# Patient Record
Sex: Female | Born: 1960 | Race: White | Hispanic: No | Smoking: Current every day smoker
Health system: Southern US, Community
[De-identification: ages and names within clinical notes are randomized; demographics above are authoritative.]

## PROBLEM LIST (undated history)

## (undated) DIAGNOSIS — I10 Essential (primary) hypertension: Secondary | ICD-10-CM

## (undated) DIAGNOSIS — R519 Headache, unspecified: Secondary | ICD-10-CM

## (undated) DIAGNOSIS — E119 Type 2 diabetes mellitus without complications: Secondary | ICD-10-CM

## (undated) DIAGNOSIS — F4310 Post-traumatic stress disorder, unspecified: Secondary | ICD-10-CM

## (undated) DIAGNOSIS — I509 Heart failure, unspecified: Secondary | ICD-10-CM

## (undated) DIAGNOSIS — F319 Bipolar disorder, unspecified: Secondary | ICD-10-CM

## (undated) DIAGNOSIS — R51 Headache: Secondary | ICD-10-CM

## (undated) DIAGNOSIS — J449 Chronic obstructive pulmonary disease, unspecified: Secondary | ICD-10-CM

## (undated) DIAGNOSIS — K219 Gastro-esophageal reflux disease without esophagitis: Secondary | ICD-10-CM

## (undated) DIAGNOSIS — G459 Transient cerebral ischemic attack, unspecified: Secondary | ICD-10-CM

## (undated) DIAGNOSIS — G4730 Sleep apnea, unspecified: Secondary | ICD-10-CM

## (undated) DIAGNOSIS — K259 Gastric ulcer, unspecified as acute or chronic, without hemorrhage or perforation: Secondary | ICD-10-CM

## (undated) DIAGNOSIS — J45909 Unspecified asthma, uncomplicated: Secondary | ICD-10-CM

## (undated) DIAGNOSIS — T148XXA Other injury of unspecified body region, initial encounter: Secondary | ICD-10-CM

## (undated) DIAGNOSIS — F200 Paranoid schizophrenia: Secondary | ICD-10-CM

## (undated) DIAGNOSIS — IMO0001 Reserved for inherently not codable concepts without codable children: Secondary | ICD-10-CM

## (undated) HISTORY — DX: Essential (primary) hypertension: I10

## (undated) HISTORY — DX: Type 2 diabetes mellitus without complications: E11.9

## (undated) HISTORY — DX: Unspecified asthma, uncomplicated: J45.909

## (undated) HISTORY — DX: Post-traumatic stress disorder, unspecified: F43.10

## (undated) HISTORY — DX: Bipolar disorder, unspecified: F31.9

## (undated) HISTORY — DX: Chronic obstructive pulmonary disease, unspecified: J44.9

## (undated) HISTORY — DX: Gastric ulcer, unspecified as acute or chronic, without hemorrhage or perforation: K25.9

## (undated) HISTORY — DX: Heart failure, unspecified: I50.9

---
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
## 2001-07-22 HISTORY — PX: BACK SURGERY: SHX140

## 2011-07-23 HISTORY — PX: CARDIAC CATHETERIZATION: SHX172

## 2012-05-25 LAB — BASIC METABOLIC PANEL
BUN: 22 mg/dL — AB (ref 4–21)
Creatinine: 1.1 mg/dL (ref 0.5–1.1)
GLUCOSE: 105 mg/dL
Potassium: 4.2 mmol/L (ref 3.4–5.3)
Sodium: 139 mmol/L (ref 137–147)

## 2012-05-25 LAB — HEPATIC FUNCTION PANEL
ALK PHOS: 118 U/L (ref 25–125)
ALT: 48 U/L — AB (ref 7–35)
AST: 37 U/L — AB (ref 13–35)
BILIRUBIN, TOTAL: 0.6 mg/dL

## 2012-05-25 LAB — CBC AND DIFFERENTIAL
HEMATOCRIT: 41 % (ref 36–46)
Hemoglobin: 14.4 g/dL (ref 12.0–16.0)
Platelets: 186 10*3/uL (ref 150–399)
WBC: 11.6 10^3/mL

## 2013-12-09 ENCOUNTER — Ambulatory Visit: Payer: Medicare Other | Admitting: Internal Medicine

## 2014-01-10 ENCOUNTER — Encounter: Payer: Self-pay | Admitting: Internal Medicine

## 2014-01-10 ENCOUNTER — Ambulatory Visit (INDEPENDENT_AMBULATORY_CARE_PROVIDER_SITE_OTHER): Payer: MEDICARE | Admitting: Internal Medicine

## 2014-01-10 VITALS — BP 142/80 | HR 90 | Temp 97.5°F | Resp 20 | Ht 65.16 in | Wt 243.2 lb

## 2014-01-10 DIAGNOSIS — I10 Essential (primary) hypertension: Secondary | ICD-10-CM

## 2014-01-10 DIAGNOSIS — G8929 Other chronic pain: Secondary | ICD-10-CM | POA: Insufficient documentation

## 2014-01-10 DIAGNOSIS — J4489 Other specified chronic obstructive pulmonary disease: Secondary | ICD-10-CM

## 2014-01-10 DIAGNOSIS — M545 Low back pain, unspecified: Secondary | ICD-10-CM

## 2014-01-10 DIAGNOSIS — J449 Chronic obstructive pulmonary disease, unspecified: Secondary | ICD-10-CM | POA: Insufficient documentation

## 2014-01-10 DIAGNOSIS — I2510 Atherosclerotic heart disease of native coronary artery without angina pectoris: Secondary | ICD-10-CM

## 2014-01-10 DIAGNOSIS — J45909 Unspecified asthma, uncomplicated: Secondary | ICD-10-CM | POA: Insufficient documentation

## 2014-01-10 DIAGNOSIS — Z9989 Dependence on other enabling machines and devices: Secondary | ICD-10-CM

## 2014-01-10 DIAGNOSIS — G4733 Obstructive sleep apnea (adult) (pediatric): Secondary | ICD-10-CM | POA: Insufficient documentation

## 2014-01-10 DIAGNOSIS — I25119 Atherosclerotic heart disease of native coronary artery with unspecified angina pectoris: Secondary | ICD-10-CM | POA: Insufficient documentation

## 2014-01-10 DIAGNOSIS — F4310 Post-traumatic stress disorder, unspecified: Secondary | ICD-10-CM | POA: Insufficient documentation

## 2014-01-10 DIAGNOSIS — I209 Angina pectoris, unspecified: Secondary | ICD-10-CM

## 2014-01-10 DIAGNOSIS — F319 Bipolar disorder, unspecified: Secondary | ICD-10-CM | POA: Insufficient documentation

## 2014-01-10 DIAGNOSIS — E119 Type 2 diabetes mellitus without complications: Secondary | ICD-10-CM | POA: Insufficient documentation

## 2014-01-10 DIAGNOSIS — J4520 Mild intermittent asthma, uncomplicated: Secondary | ICD-10-CM

## 2014-01-10 MED ORDER — LORAZEPAM 1 MG PO TABS: 1.0000 mg | ORAL_TABLET | Freq: Three times a day (TID) | ORAL | Status: DC | PRN

## 2014-01-10 MED ORDER — FUROSEMIDE 20 MG PO TABS: 20.0000 mg | ORAL_TABLET | Freq: Every day | ORAL | Status: AC

## 2014-01-10 MED ORDER — POTASSIUM CHLORIDE ER 10 MEQ PO TBCR: 10.0000 meq | EXTENDED_RELEASE_TABLET | Freq: Every day | ORAL | Status: DC

## 2014-01-10 MED ORDER — FLUOXETINE HCL 20 MG PO TABS: 20.0000 mg | ORAL_TABLET | Freq: Every day | ORAL | Status: DC

## 2014-01-10 MED ORDER — LORAZEPAM 1 MG PO TABS
1.0000 mg | ORAL_TABLET | Freq: Three times a day (TID) | ORAL | Status: DC | PRN
Start: 2014-01-10 — End: 2014-02-20

## 2014-01-10 MED ORDER — IPRATROPIUM-ALBUTEROL 0.5-2.5 (3) MG/3ML IN SOLN: 3.0000 mL | Freq: Four times a day (QID) | RESPIRATORY_TRACT | Status: DC | PRN

## 2014-01-10 MED ORDER — GABAPENTIN 400 MG PO CAPS: 400.0000 mg | ORAL_CAPSULE | Freq: Three times a day (TID) | ORAL | Status: DC | PRN

## 2014-01-10 MED ORDER — TRAMADOL HCL 50 MG PO TABS: 50.0000 mg | ORAL_TABLET | Freq: Three times a day (TID) | ORAL | Status: DC | PRN

## 2014-01-10 MED ORDER — ALBUTEROL SULFATE HFA 108 (90 BASE) MCG/ACT IN AERS: 1.0000 | INHALATION_SPRAY | Freq: Four times a day (QID) | RESPIRATORY_TRACT | Status: DC | PRN

## 2014-01-10 MED ORDER — CARVEDILOL 6.25 MG PO TABS: 6.2500 mg | ORAL_TABLET | Freq: Two times a day (BID) | ORAL | Status: DC

## 2014-01-10 MED ORDER — BACLOFEN 10 MG PO TABS: 10.0000 mg | ORAL_TABLET | Freq: Three times a day (TID) | ORAL | Status: DC | PRN

## 2014-01-10 MED ORDER — PRAVASTATIN SODIUM 20 MG PO TABS: 20.0000 mg | ORAL_TABLET | Freq: Every day | ORAL | Status: DC
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
## 2014-01-11 ENCOUNTER — Encounter: Payer: Self-pay | Admitting: *Deleted

## 2014-01-11 LAB — CBC WITH DIFFERENTIAL
Basophils Absolute: 0 10*3/uL (ref 0.0–0.2)
Basos: 0 %
Eos: 1 %
Eosinophils Absolute: 0.1 10*3/uL (ref 0.0–0.4)
HCT: 43.3 % (ref 34.0–46.6)
Hemoglobin: 14.1 g/dL (ref 11.1–15.9)
Immature Grans (Abs): 0 10*3/uL (ref 0.0–0.1)
Immature Granulocytes: 0 %
Lymphocytes Absolute: 3.2 10*3/uL — ABNORMAL HIGH (ref 0.7–3.1)
Lymphs: 31 %
MCH: 28.3 pg (ref 26.6–33.0)
MCHC: 32.6 g/dL (ref 31.5–35.7)
MCV: 87 fL (ref 79–97)
Monocytes Absolute: 0.5 10*3/uL (ref 0.1–0.9)
Monocytes: 5 %
Neutrophils Absolute: 6.3 10*3/uL (ref 1.4–7.0)
Neutrophils Relative %: 63 %
Platelets: 242 10*3/uL (ref 150–379)
RBC: 4.98 x10E6/uL (ref 3.77–5.28)
RDW: 14.9 % (ref 12.3–15.4)
WBC: 10.1 10*3/uL (ref 3.4–10.8)

## 2014-01-11 LAB — COMPREHENSIVE METABOLIC PANEL
ALT: 22 IU/L (ref 0–32)
AST: 16 IU/L (ref 0–40)
Albumin/Globulin Ratio: 1.5 (ref 1.1–2.5)
Albumin: 4.1 g/dL (ref 3.5–5.5)
Alkaline Phosphatase: 104 IU/L (ref 39–117)
BUN/Creatinine Ratio: 16 (ref 9–23)
BUN: 15 mg/dL (ref 6–24)
CO2: 21 mmol/L (ref 18–29)
Calcium: 9.4 mg/dL (ref 8.7–10.2)
Chloride: 102 mmol/L (ref 97–108)
Creatinine, Ser: 0.94 mg/dL (ref 0.57–1.00)
GFR calc Af Amer: 79 mL/min/{1.73_m2} (ref >59)
GFR calc non Af Amer: 69 mL/min/{1.73_m2} (ref >59)
Globulin, Total: 2.8 g/dL (ref 1.5–4.5)
Glucose: 82 mg/dL (ref 65–99)
Potassium: 4.7 mmol/L (ref 3.5–5.2)
Sodium: 140 mmol/L (ref 134–144)
Total Bilirubin: 0.3 mg/dL (ref 0.0–1.2)
Total Protein: 6.9 g/dL (ref 6.0–8.5)

## 2014-01-11 LAB — LIPID PANEL
Chol/HDL Ratio: 5.6 ratio units — ABNORMAL HIGH (ref 0.0–4.4)
Cholesterol, Total: 248 mg/dL — ABNORMAL HIGH (ref 100–199)
HDL: 44 mg/dL (ref >39)
LDL Calculated: 163 mg/dL — ABNORMAL HIGH (ref 0–99)
Triglycerides: 203 mg/dL — ABNORMAL HIGH (ref 0–149)
VLDL Cholesterol Cal: 41 mg/dL — ABNORMAL HIGH (ref 5–40)

## 2014-01-11 LAB — HEMOGLOBIN A1C
Est. average glucose Bld gHb Est-mCnc: 123 mg/dL
Hgb A1c MFr Bld: 5.9 % — ABNORMAL HIGH (ref 4.8–5.6)

## 2014-01-18 ENCOUNTER — Telehealth: Payer: Self-pay | Admitting: *Deleted

## 2014-01-18 NOTE — Telephone Encounter (Signed)
Patient called and stated that the Tramadol prescribed is causing Sleepiness. Please Advise.

## 2014-01-18 NOTE — Telephone Encounter (Signed)
The alternative stronger medications will only make sleepiness worse.  Does it help her pain?

## 2014-01-18 NOTE — Telephone Encounter (Signed)
LMOM to return call.

## 2014-01-18 NOTE — Telephone Encounter (Signed)
Patient also stated that she cannot afford all of her medications. She did get the Cholesterol, BP, Prozac, Lorazepam, Tramadol, Lasix,

## 2014-01-20 MED ORDER — DULOXETINE HCL 60 MG PO CPEP: 60.0000 mg | ORAL_CAPSULE | Freq: Every day | ORAL | Status: DC

## 2014-01-20 MED ORDER — DULOXETINE HCL 30 MG PO CPEP: ORAL_CAPSULE | ORAL | Status: DC

## 2014-01-20 NOTE — Telephone Encounter (Signed)
Patient was aware of medication changes. 2 rx's sent to the pharmacy for 30 mg and 60 mg

## 2014-01-20 NOTE — Telephone Encounter (Signed)
Stop tramadol.  Let's start cymbalta 30mg  daily for 2 weeks, then increase to 60mg  daily thereafter.

## 2014-01-20 NOTE — Telephone Encounter (Signed)
Spoke with patient, patient states Tramadol is not helping manage her pain. Patient in bed x 48 hours due to pain. Please advise

## 2014-01-27 ENCOUNTER — Ambulatory Visit: Payer: Self-pay | Admitting: Internal Medicine

## 2014-02-02 ENCOUNTER — Telehealth: Payer: Self-pay | Admitting: *Deleted

## 2014-02-02 MED ORDER — CITALOPRAM HYDROBROMIDE 20 MG PO TABS: ORAL_TABLET | ORAL | Status: DC

## 2014-02-02 NOTE — Telephone Encounter (Signed)
D/c duloxetine (all doses).  Start celexa 20mg  daily for one week, then 40mg  daily thereafter.  I recommend she get a drug plan so she can afford some medications.  Celexa is on the [facility] $5 list.  [facility]

## 2014-02-02 NOTE — Telephone Encounter (Signed)
Patient called and stated that she cannot afford the Duloxetine 30mg . She completed the first step but cannot afford the second. It is over $400.00. Please Advise.

## 2014-02-02 NOTE — Telephone Encounter (Signed)
Called [facility] [address] and spoke with Kathie RhodesBetty and she stated that patient picked up only 7 pills of 30mg  and it cost $51.97. Patient has no insurance has only a [employer] discount card. Please Advise.

## 2014-02-02 NOTE — Telephone Encounter (Signed)
Patient Notified and [facility] into [facility] [address]. D/C Duloxetine from patient's medication list.

## 2014-02-02 NOTE — Telephone Encounter (Signed)
Of course.  It was probably the best drug for her pain and depression.  The first step was 30mg --let's have her continue on 30mg .  Call pharmacy to find out if you give her 60 30mg  pills how much it will cost.  Maybe we can get her dose up that way.

## 2014-02-07 ENCOUNTER — Telehealth: Payer: Self-pay | Admitting: *Deleted

## 2014-02-07 DIAGNOSIS — R52 Pain, unspecified: Secondary | ICD-10-CM

## 2014-02-07 NOTE — Telephone Encounter (Signed)
Patient called and stated that the Celexa 20mg  that she was prescribed is not helping. It did the same thing the Tramadol did and put her to sleep. Thinks it is counter acting with another medication. No pain relief with this medication either. Patient stated that she wanted me to make you aware that she is allergic to Dioxide and this medication has Dioxide in it.  Please Advise.

## 2014-02-09 NOTE — Telephone Encounter (Signed)
LMOM to return call.

## 2014-02-09 NOTE — Addendum Note (Signed)
Addended by: Nelda SevereMAY, ANITA A on: 02/09/2014 11:25 AM   Modules accepted: Orders, Medications

## 2014-02-09 NOTE — Telephone Encounter (Signed)
Discontinue the celexa.  I would like to refer her to a pain clinic and a psychiatrist.

## 2014-02-09 NOTE — Telephone Encounter (Signed)
Patient Notified and patient will call and set up her psychiatrist appointment. Placed order into Epic for pain clinic Referral

## 2014-02-20 ENCOUNTER — Encounter (HOSPITAL_COMMUNITY): Payer: Self-pay | Admitting: Emergency Medicine

## 2014-02-20 ENCOUNTER — Emergency Department (HOSPITAL_COMMUNITY)
Admission: EM | Admit: 2014-02-20 | Discharge: 2014-02-20 | Disposition: A | Discharge status: Home / Self Care | Payer: Medicare Other | Attending: Emergency Medicine | Admitting: Emergency Medicine

## 2014-02-20 ENCOUNTER — Other Ambulatory Visit: Payer: Self-pay | Admitting: Internal Medicine

## 2014-02-20 DIAGNOSIS — G43809 Other migraine, not intractable, without status migrainosus: Secondary | ICD-10-CM | POA: Insufficient documentation

## 2014-02-20 DIAGNOSIS — I10 Essential (primary) hypertension: Secondary | ICD-10-CM | POA: Insufficient documentation

## 2014-02-20 DIAGNOSIS — Z880 Allergy status to penicillin: Secondary | ICD-10-CM | POA: Insufficient documentation

## 2014-02-20 DIAGNOSIS — M5441 Lumbago with sciatica, right side: Secondary | ICD-10-CM

## 2014-02-20 DIAGNOSIS — M5430 Sciatica, unspecified side: Secondary | ICD-10-CM | POA: Insufficient documentation

## 2014-02-20 DIAGNOSIS — Z8719 Personal history of other diseases of the digestive system: Secondary | ICD-10-CM | POA: Insufficient documentation

## 2014-02-20 DIAGNOSIS — F17200 Nicotine dependence, unspecified, uncomplicated: Secondary | ICD-10-CM | POA: Diagnosis not present

## 2014-02-20 DIAGNOSIS — R51 Headache: Secondary | ICD-10-CM | POA: Diagnosis present

## 2014-02-20 DIAGNOSIS — J4489 Other specified chronic obstructive pulmonary disease: Secondary | ICD-10-CM | POA: Insufficient documentation

## 2014-02-20 DIAGNOSIS — Z7982 Long term (current) use of aspirin: Secondary | ICD-10-CM | POA: Diagnosis not present

## 2014-02-20 DIAGNOSIS — F319 Bipolar disorder, unspecified: Secondary | ICD-10-CM | POA: Insufficient documentation

## 2014-02-20 DIAGNOSIS — J449 Chronic obstructive pulmonary disease, unspecified: Secondary | ICD-10-CM | POA: Diagnosis not present

## 2014-02-20 DIAGNOSIS — I509 Heart failure, unspecified: Secondary | ICD-10-CM | POA: Insufficient documentation

## 2014-02-20 DIAGNOSIS — M5442 Lumbago with sciatica, left side: Secondary | ICD-10-CM

## 2014-02-20 DIAGNOSIS — Z79899 Other long term (current) drug therapy: Secondary | ICD-10-CM | POA: Diagnosis not present

## 2014-02-20 DIAGNOSIS — E119 Type 2 diabetes mellitus without complications: Secondary | ICD-10-CM | POA: Insufficient documentation

## 2014-02-20 DIAGNOSIS — Z791 Long term (current) use of non-steroidal anti-inflammatories (NSAID): Secondary | ICD-10-CM | POA: Insufficient documentation

## 2014-02-20 DIAGNOSIS — G43C1 Periodic headache syndromes in child or adult, intractable: Secondary | ICD-10-CM

## 2014-02-20 HISTORY — DX: Paranoid schizophrenia: F20.0

## 2014-02-20 MED ORDER — OXYCODONE-ACETAMINOPHEN 5-325 MG PO TABS: 1.0000 | ORAL_TABLET | Freq: Four times a day (QID) | ORAL | Status: DC | PRN

## 2014-02-20 MED ORDER — OXYCODONE HCL 5 MG PO TABS
15.0000 mg | ORAL_TABLET | Freq: Once | ORAL | Status: AC
  Administered 2014-02-20: 15 mg via ORAL
  Filled 2014-02-20: qty 3

## 2014-02-20 NOTE — Discharge Instructions (Signed)
Please do not operate heavy machinery after taking your percocet. Please follow up with your outpatient clinic for further management of your pain.

## 2014-02-20 NOTE — ED Notes (Signed)
She c/o sudden onset of frontal h/a with emesis at 0700 this morning.  She states she vomited a total of three times at that time.  She states She took several ("eighteen") Advil today in attempt to ease her h/a and chronic back pain.  She is in no distress.  She tells me she has "hardware in my back) from previous injury.

## 2014-02-20 NOTE — ED Notes (Signed)
Pt from home c/o HTN, back pain, emesis and HA. Pt adds that she had SOB this am and did a Duo neb tx at home. Pt has hx of COPD, Asthma and bronchitis. Pt is a current every day smoker. Pt adds that she is having difficulty ambulating and states "my leg feel like they are going to give out on me and my pain is 150." Pt N/V started around 3:30 this am with 3 instances of emesis. Pt states that she has taken 18 ibuprofen and 2 Fioricet since 3:30 am today. Pt states that the emesis was before taking meds and has been able to keep them all down. Pt is A&O and in NAD

## 2014-02-20 NOTE — ED Provider Notes (Signed)
CSN: [id number]     Arrival date & time 02/20/14  1322 History   First MD Initiated Contact with Patient 02/20/14 1344     Chief Complaint  Patient presents with  . Headache  . Hypertension  . Emesis  . Back Pain    HPI  Elah Avellino is a 53 year old female with a history of bipolar disorder, PTSD, chronic low back pain, severe obesity who presents today for headaches. She reports that she woke up from sleep with a headache that started on the left side of her head and eventually wrapped around her entire head, 10/10 in severity, intense pressure in quality. She had some associated nausea and emesis. She tried to take ibuprofen for this, but that did not help to alleviate her symptoms. She also developed an exacerbation of her chronic back pain which she said was also 10/10, and was a sharp pain that radiated down to both of her legs. This is consistent with her chronic symptoms. The ibuprofen did not help with this either. Does report some elevated blood pressures on her home cuff, but these had pretty much resolved once she arrived at the hospital. She has a long history of pain and for several years, had been on methadone. Has not been on this in the last couple months.   She denies any fevers, chills, abdominal pain, diarrhea, constipation, dysuria, or hematuria.    Past Medical History  Diagnosis Date  . COPD (chronic obstructive pulmonary disease)   . Asthma   . Bipolar disorder, unspecified   . High blood pressure   . PTSD (post-traumatic stress disorder)   . Gastric ulcer   . CHF (congestive heart failure)   . Diabetes mellitus type II, controlled    Past Surgical History  Procedure Laterality Date  . Tonsillectomy  1968  . Cesarean section  1983  . Back surgery  2003    Dr.Broom, has lumbar spine implants TSRH-3D spinal system  . Cardiac catheterization  2013   Family History  Problem Relation Age of Onset  . Diabetes Maternal Grandmother   . Cancer Maternal  Grandmother     breast  . Hyperlipidemia Father   . Hypertension Father   . Cancer Paternal Grandmother     breast   History  Substance Use Topics  . Smoking status: Current Every Day Smoker -- 1.00 packs/day for 45 years    Types: Cigarettes  . Smokeless tobacco: Not on file  . Alcohol Use: Not on file   OB History   Grav Para Term Preterm Abortions TAB SAB Ect Mult Living                 Review of Systems Per HPI.   Allergies  Abilify; Hydrocodone; and Penicillins  Home Medications   Prior to Admission medications   Medication Sig Start Date End Date Taking? Authorizing Provider  albuterol (PROVENTIL HFA;VENTOLIN HFA) 108 (90 BASE) MCG/ACT inhaler Inhale 1 puff into the lungs every 6 (six) hours as needed for wheezing or shortness of breath. 01/10/14   [name] L [name], DO  aspirin EC 81 MG tablet Take 81 mg by mouth daily.    Historical Provider, MD  baclofen (LIORESAL) 10 MG tablet Take 1 tablet (10 mg total) by mouth 3 (three) times daily as needed for muscle spasms. 01/10/14   [name] L [name], DO  carvedilol (COREG) 6.25 MG tablet Take 1 tablet (6.25 mg total) by mouth 2 (two) times daily with a meal. 01/10/14  [name] L [name], DO  FLUoxetine (PROZAC) 20 MG tablet Take 1 tablet (20 mg total) by mouth daily. 01/10/14   [name] L [name], DO  furosemide (LASIX) 20 MG tablet Take 1 tablet (20 mg total) by mouth daily. 01/10/14   [name] L [name], DO  gabapentin (NEURONTIN) 400 MG capsule Take 1 capsule (400 mg total) by mouth 3 (three) times daily as needed. 01/10/14   [name] L [name], DO  ipratropium-albuterol (DUONEB) 0.5-2.5 (3) MG/3ML SOLN Take 3 mLs by nebulization every 6 (six) hours as needed. 01/10/14   [name] L [name], DO  loratadine (ALLERGY) 10 MG tablet Take 10 mg by mouth daily.    Historical Provider, MD  LORazepam (ATIVAN) 1 MG tablet Take 1 tablet (1 mg total) by mouth every 8 (eight) hours as needed for anxiety. 01/10/14   [name] L [name], DO  nitroGLYCERIN (NITROLINGUAL) 0.4  MG/SPRAY spray Place 1 spray under the tongue every 5 (five) minutes x 3 doses as needed for chest pain.    Historical Provider, MD  potassium chloride (KLOR-CON 10) 10 MEQ tablet Take 1 tablet (10 mEq total) by mouth daily. 01/10/14   [name] L [name], DO  pravastatin (PRAVACHOL) 20 MG tablet Take 1 tablet (20 mg total) by mouth daily. 01/10/14   [name] L [name], DO  ranitidine (ACID REDUCER) 150 MG tablet Take 150 mg by mouth as needed for heartburn.    Historical Provider, MD   BP 139/76  Pulse 93  Temp(Src) 98.1 F (36.7 C) (Oral)  Resp 17  SpO2 95% Physical Exam  Constitutional: She is oriented to person, place, and time. She appears well-developed and well-nourished. No distress.  HENT:  Head: Normocephalic and atraumatic.  Eyes: EOM are normal. Pupils are equal, round, and reactive to light.  Neck: No tracheal deviation present. No thyromegaly present.  Cardiovascular: Normal rate and regular rhythm.  Exam reveals no gallop and no friction rub.   No murmur heard. Pulmonary/Chest: No respiratory distress. She has no wheezes. She has no rales.  Minimal effort   Abdominal: Soft. She exhibits no distension. There is no tenderness.  Musculoskeletal:  Paraspinal tenderness upon palpation  Neurological: She is alert and oriented to person, place, and time. No cranial nerve deficit.  Strength and sensation grossly intact in upper and lower extremities.  Skin: Skin is warm and dry.     ED Course  Procedures (including critical care time) Labs Review Labs Reviewed - No data to display  Imaging Review No results found.   EKG Interpretation None      MDM   Final diagnoses:  None    Patient with recurrent symptoms of her chronic migraines and back pain. No focal neurological symptoms to suggest more concerning pathology at this point. Nausea has resolved since onset of symptoms.   - Percocet for pain management bridge until she can be seen in outpatient clinic.      Harold BarbanLawrence Esiah Bazinet, MD 02/20/14 1536

## 2014-02-21 ENCOUNTER — Other Ambulatory Visit: Payer: Self-pay | Admitting: *Deleted

## 2014-02-21 DIAGNOSIS — M549 Dorsalgia, unspecified: Secondary | ICD-10-CM

## 2014-02-21 DIAGNOSIS — R51 Headache: Secondary | ICD-10-CM

## 2014-02-21 NOTE — ED Provider Notes (Signed)
I saw and evaluated the patient, reviewed the resident's note and I agree with the findings and plan.  Pt w hx headaches, c/o gradual onset frontal headache c/w prior. No temporal tenderness. No neck stiffness or rigidity. Motor/sen intact. afeb.   Suzi RootsKevin E Elloise Roark, MD 02/21/14 [phone]

## 2014-02-28 ENCOUNTER — Other Ambulatory Visit (HOSPITAL_COMMUNITY): Payer: Self-pay | Admitting: Internal Medicine

## 2014-02-28 DIAGNOSIS — Z1231 Encounter for screening mammogram for malignant neoplasm of breast: Secondary | ICD-10-CM

## 2014-03-03 ENCOUNTER — Ambulatory Visit: Payer: Medicare Other | Admitting: Internal Medicine

## 2014-03-03 DIAGNOSIS — Z0289 Encounter for other administrative examinations: Secondary | ICD-10-CM

## 2014-04-14 ENCOUNTER — Ambulatory Visit (HOSPITAL_COMMUNITY): Payer: Medicare Other

## 2014-05-12 ENCOUNTER — Encounter: Payer: Medicare Other | Admitting: Internal Medicine

## 2014-05-21 ENCOUNTER — Other Ambulatory Visit: Payer: Self-pay | Admitting: Internal Medicine

## 2014-05-23 ENCOUNTER — Emergency Department (HOSPITAL_COMMUNITY): Payer: MEDICARE

## 2014-05-23 ENCOUNTER — Inpatient Hospital Stay (HOSPITAL_COMMUNITY)
Admission: EM | Admit: 2014-05-23 | Discharge: 2014-05-24 | DRG: 191 | Disposition: A | Discharge status: Home / Self Care | Payer: MEDICARE | Attending: Family Medicine | Admitting: Family Medicine

## 2014-05-23 ENCOUNTER — Encounter (HOSPITAL_COMMUNITY): Payer: Self-pay | Admitting: Emergency Medicine

## 2014-05-23 DIAGNOSIS — G8929 Other chronic pain: Secondary | ICD-10-CM | POA: Diagnosis present

## 2014-05-23 DIAGNOSIS — F200 Paranoid schizophrenia: Secondary | ICD-10-CM | POA: Diagnosis present

## 2014-05-23 DIAGNOSIS — I25119 Atherosclerotic heart disease of native coronary artery with unspecified angina pectoris: Secondary | ICD-10-CM | POA: Diagnosis present

## 2014-05-23 DIAGNOSIS — Z7982 Long term (current) use of aspirin: Secondary | ICD-10-CM

## 2014-05-23 DIAGNOSIS — J441 Chronic obstructive pulmonary disease with (acute) exacerbation: Secondary | ICD-10-CM | POA: Diagnosis not present

## 2014-05-23 DIAGNOSIS — R059 Cough, unspecified: Secondary | ICD-10-CM

## 2014-05-23 DIAGNOSIS — I509 Heart failure, unspecified: Secondary | ICD-10-CM | POA: Diagnosis present

## 2014-05-23 DIAGNOSIS — J45909 Unspecified asthma, uncomplicated: Secondary | ICD-10-CM | POA: Diagnosis present

## 2014-05-23 DIAGNOSIS — Z79899 Other long term (current) drug therapy: Secondary | ICD-10-CM

## 2014-05-23 DIAGNOSIS — I10 Essential (primary) hypertension: Secondary | ICD-10-CM | POA: Diagnosis present

## 2014-05-23 DIAGNOSIS — M545 Low back pain, unspecified: Secondary | ICD-10-CM | POA: Diagnosis present

## 2014-05-23 DIAGNOSIS — F17210 Nicotine dependence, cigarettes, uncomplicated: Secondary | ICD-10-CM | POA: Diagnosis present

## 2014-05-23 DIAGNOSIS — F4310 Post-traumatic stress disorder, unspecified: Secondary | ICD-10-CM | POA: Diagnosis present

## 2014-05-23 DIAGNOSIS — E119 Type 2 diabetes mellitus without complications: Secondary | ICD-10-CM | POA: Diagnosis present

## 2014-05-23 DIAGNOSIS — R197 Diarrhea, unspecified: Secondary | ICD-10-CM

## 2014-05-23 DIAGNOSIS — R05 Cough: Secondary | ICD-10-CM | POA: Diagnosis not present

## 2014-05-23 DIAGNOSIS — Z9989 Dependence on other enabling machines and devices: Secondary | ICD-10-CM

## 2014-05-23 DIAGNOSIS — F319 Bipolar disorder, unspecified: Secondary | ICD-10-CM | POA: Diagnosis present

## 2014-05-23 DIAGNOSIS — G4733 Obstructive sleep apnea (adult) (pediatric): Secondary | ICD-10-CM | POA: Diagnosis present

## 2014-05-23 DIAGNOSIS — Z8673 Personal history of transient ischemic attack (TIA), and cerebral infarction without residual deficits: Secondary | ICD-10-CM | POA: Diagnosis not present

## 2014-05-23 DIAGNOSIS — F3170 Bipolar disorder, currently in remission, most recent episode unspecified: Secondary | ICD-10-CM | POA: Diagnosis not present

## 2014-05-23 LAB — BASIC METABOLIC PANEL
Anion gap: 13 (ref 5–15)
BUN: 11 mg/dL (ref 6–23)
CO2: 27 mEq/L (ref 19–32)
Calcium: 9.1 mg/dL (ref 8.4–10.5)
Chloride: 104 mEq/L (ref 96–112)
Creatinine, Ser: 1.01 mg/dL (ref 0.50–1.10)
GFR calc Af Amer: 71 mL/min — ABNORMAL LOW (ref >90)
GFR, EST NON AFRICAN AMERICAN: 61 mL/min — AB (ref >90)
Glucose, Bld: 98 mg/dL (ref 70–99)
Potassium: 4.6 mEq/L (ref 3.7–5.3)
SODIUM: 144 meq/L (ref 137–147)

## 2014-05-23 LAB — CBC WITH DIFFERENTIAL/PLATELET
BASOS ABS: 0 10*3/uL (ref 0.0–0.1)
Basophils Relative: 0 % (ref 0–1)
EOS ABS: 0.2 10*3/uL (ref 0.0–0.7)
Eosinophils Relative: 2 % (ref 0–5)
HCT: 42.8 % (ref 36.0–46.0)
Hemoglobin: 13.9 g/dL (ref 12.0–15.0)
Lymphocytes Relative: 37 % (ref 12–46)
Lymphs Abs: 3.6 10*3/uL (ref 0.7–4.0)
MCH: 28.8 pg (ref 26.0–34.0)
MCHC: 32.5 g/dL (ref 30.0–36.0)
MCV: 88.8 fL (ref 78.0–100.0)
Monocytes Absolute: 0.3 10*3/uL (ref 0.1–1.0)
Monocytes Relative: 4 % (ref 3–12)
NEUTROS PCT: 57 % (ref 43–77)
Neutro Abs: 5.6 10*3/uL (ref 1.7–7.7)
PLATELETS: 192 10*3/uL (ref 150–400)
RBC: 4.82 MIL/uL (ref 3.87–5.11)
RDW: 14.3 % (ref 11.5–15.5)
WBC: 9.8 10*3/uL (ref 4.0–10.5)

## 2014-05-23 LAB — I-STAT TROPONIN, ED: TROPONIN I, POC: 0.05 ng/mL (ref 0.00–0.08)

## 2014-05-23 LAB — PRO B NATRIURETIC PEPTIDE: PRO B NATRI PEPTIDE: 129.5 pg/mL — AB (ref 0–125)

## 2014-05-23 LAB — TROPONIN I: Troponin I: <0.3 ng/mL (ref <0.30)

## 2014-05-23 MED ORDER — ALBUTEROL SULFATE (2.5 MG/3ML) 0.083% IN NEBU: 2.5000 mg | INHALATION_SOLUTION | RESPIRATORY_TRACT | Status: DC | PRN

## 2014-05-23 MED ORDER — IBUPROFEN 200 MG PO TABS: 600.0000 mg | ORAL_TABLET | Freq: Four times a day (QID) | ORAL | Status: DC | PRN

## 2014-05-23 MED ORDER — LEVOFLOXACIN 750 MG PO TABS
750.0000 mg | ORAL_TABLET | ORAL | Status: DC
  Administered 2014-05-23: 750 mg via ORAL
  Filled 2014-05-23 (×2): qty 1

## 2014-05-23 MED ORDER — FAMOTIDINE 20 MG PO TABS
20.0000 mg | ORAL_TABLET | Freq: Every day | ORAL | Status: DC
  Administered 2014-05-23 – 2014-05-24 (×2): 20 mg via ORAL
  Filled 2014-05-23 (×2): qty 1

## 2014-05-23 MED ORDER — PRAVASTATIN SODIUM 20 MG PO TABS
20.0000 mg | ORAL_TABLET | Freq: Every day | ORAL | Status: DC
  Administered 2014-05-24: 20 mg via ORAL
  Filled 2014-05-23: qty 1

## 2014-05-23 MED ORDER — NITROGLYCERIN 0.4 MG/SPRAY TL SOLN
1.0000 | Status: DC | PRN
  Filled 2014-05-23: qty 4.9

## 2014-05-23 MED ORDER — HEPARIN SODIUM (PORCINE) 5000 UNIT/ML IJ SOLN
5000.0000 [IU] | Freq: Three times a day (TID) | INTRAMUSCULAR | Status: DC
  Administered 2014-05-23 – 2014-05-24 (×2): 5000 [IU] via SUBCUTANEOUS
  Filled 2014-05-23 (×5): qty 1

## 2014-05-23 MED ORDER — METHYLPREDNISOLONE SODIUM SUCC 125 MG IJ SOLR
60.0000 mg | Freq: Once | INTRAMUSCULAR | Status: DC
  Filled 2014-05-23: qty 0.96

## 2014-05-23 MED ORDER — IPRATROPIUM BROMIDE 0.02 % IN SOLN
0.5000 mg | Freq: Once | RESPIRATORY_TRACT | Status: AC
  Administered 2014-05-23: 0.5 mg via RESPIRATORY_TRACT
  Filled 2014-05-23: qty 2.5

## 2014-05-23 MED ORDER — ASPIRIN EC 81 MG PO TBEC
81.0000 mg | DELAYED_RELEASE_TABLET | Freq: Every day | ORAL | Status: DC
  Administered 2014-05-24: 81 mg via ORAL
  Filled 2014-05-23: qty 1

## 2014-05-23 MED ORDER — IPRATROPIUM BROMIDE 0.02 % IN SOLN: 0.5000 mg | RESPIRATORY_TRACT | Status: DC

## 2014-05-23 MED ORDER — SODIUM CHLORIDE 0.9 % IJ SOLN
3.0000 mL | Freq: Two times a day (BID) | INTRAMUSCULAR | Status: DC
  Administered 2014-05-23: 3 mL via INTRAVENOUS

## 2014-05-23 MED ORDER — SODIUM CHLORIDE 0.9 % IV SOLN: 250.0000 mL | INTRAVENOUS | Status: DC | PRN

## 2014-05-23 MED ORDER — OXYCODONE-ACETAMINOPHEN 5-325 MG PO TABS
1.0000 | ORAL_TABLET | Freq: Four times a day (QID) | ORAL | Status: DC | PRN
  Administered 2014-05-24: 2 via ORAL
  Filled 2014-05-23: qty 2

## 2014-05-23 MED ORDER — GABAPENTIN 400 MG PO CAPS
400.0000 mg | ORAL_CAPSULE | Freq: Three times a day (TID) | ORAL | Status: DC
Start: 2014-05-23 — End: 2014-05-24
  Administered 2014-05-23 – 2014-05-24 (×2): 400 mg via ORAL
  Filled 2014-05-23 (×4): qty 1

## 2014-05-23 MED ORDER — ALBUTEROL SULFATE (2.5 MG/3ML) 0.083% IN NEBU: 2.5000 mg | INHALATION_SOLUTION | RESPIRATORY_TRACT | Status: DC

## 2014-05-23 MED ORDER — IPRATROPIUM-ALBUTEROL 0.5-2.5 (3) MG/3ML IN SOLN
3.0000 mL | RESPIRATORY_TRACT | Status: DC
  Administered 2014-05-23: 3 mL via RESPIRATORY_TRACT
  Filled 2014-05-23: qty 3

## 2014-05-23 MED ORDER — FLUOXETINE HCL 20 MG PO CAPS
20.0000 mg | ORAL_CAPSULE | Freq: Every day | ORAL | Status: DC
  Administered 2014-05-24: 20 mg via ORAL
  Filled 2014-05-23: qty 1

## 2014-05-23 MED ORDER — FUROSEMIDE 20 MG PO TABS
20.0000 mg | ORAL_TABLET | Freq: Every day | ORAL | Status: DC
  Administered 2014-05-24: 20 mg via ORAL
  Filled 2014-05-23: qty 1

## 2014-05-23 MED ORDER — CARVEDILOL 6.25 MG PO TABS
6.2500 mg | ORAL_TABLET | Freq: Two times a day (BID) | ORAL | Status: DC
  Administered 2014-05-24 (×2): 6.25 mg via ORAL
  Filled 2014-05-23 (×3): qty 1

## 2014-05-23 MED ORDER — METHYLPREDNISOLONE SODIUM SUCC 125 MG IJ SOLR
125.0000 mg | Freq: Once | INTRAMUSCULAR | Status: AC
  Administered 2014-05-23: 125 mg via INTRAVENOUS
  Filled 2014-05-23: qty 2

## 2014-05-23 MED ORDER — PANTOPRAZOLE SODIUM 40 MG PO TBEC
40.0000 mg | DELAYED_RELEASE_TABLET | Freq: Every day | ORAL | Status: DC
  Administered 2014-05-24: 40 mg via ORAL
  Filled 2014-05-23: qty 1

## 2014-05-23 MED ORDER — CODEINE SULFATE 30 MG PO TABS: 15.0000 mg | ORAL_TABLET | Freq: Four times a day (QID) | ORAL | Status: DC | PRN

## 2014-05-23 MED ORDER — ALBUTEROL SULFATE (2.5 MG/3ML) 0.083% IN NEBU
5.0000 mg | INHALATION_SOLUTION | Freq: Once | RESPIRATORY_TRACT | Status: AC
  Administered 2014-05-23: 5 mg via RESPIRATORY_TRACT
  Filled 2014-05-23: qty 6

## 2014-05-23 MED ORDER — METHYLPREDNISOLONE SODIUM SUCC 125 MG IJ SOLR
60.0000 mg | Freq: Four times a day (QID) | INTRAMUSCULAR | Status: DC
  Administered 2014-05-23 – 2014-05-24 (×2): 60 mg via INTRAVENOUS
  Filled 2014-05-23 (×6): qty 0.96

## 2014-05-23 MED ORDER — SODIUM CHLORIDE 0.9 % IJ SOLN: 3.0000 mL | INTRAMUSCULAR | Status: DC | PRN

## 2014-05-23 MED ORDER — BUTALBITAL-APAP-CAFFEINE 50-325-40 MG PO TABS: 1.0000 | ORAL_TABLET | Freq: Two times a day (BID) | ORAL | Status: DC | PRN

## 2014-05-23 MED ORDER — IPRATROPIUM-ALBUTEROL 0.5-2.5 (3) MG/3ML IN SOLN
3.0000 mL | Freq: Four times a day (QID) | RESPIRATORY_TRACT | Status: DC
  Administered 2014-05-24: 3 mL via RESPIRATORY_TRACT
  Filled 2014-05-23: qty 3

## 2014-05-23 MED ORDER — LORATADINE 10 MG PO TABS
10.0000 mg | ORAL_TABLET | Freq: Every day | ORAL | Status: DC | PRN
  Filled 2014-05-23: qty 1

## 2014-05-23 MED ORDER — ALBUTEROL (5 MG/ML) CONTINUOUS INHALATION SOLN
10.0000 mg/h | INHALATION_SOLUTION | RESPIRATORY_TRACT | Status: DC
  Administered 2014-05-23: 10 mg/h via RESPIRATORY_TRACT
  Filled 2014-05-23: qty 20

## 2014-05-23 MED ORDER — BACLOFEN 10 MG PO TABS
10.0000 mg | ORAL_TABLET | Freq: Three times a day (TID) | ORAL | Status: DC | PRN
  Administered 2014-05-24: 10 mg via ORAL
  Filled 2014-05-23 (×3): qty 1

## 2014-05-23 MED ORDER — LORAZEPAM 1 MG PO TABS: 1.0000 mg | ORAL_TABLET | Freq: Three times a day (TID) | ORAL | Status: DC | PRN

## 2014-05-23 NOTE — ED Provider Notes (Signed)
CSN: [id number]     Arrival date & time 05/23/14  1633 History   First MD Initiated Contact with Patient 05/23/14 1635     Chief Complaint  Patient presents with  . Shortness of Breath     (Consider location/radiation/quality/duration/timing/severity/associated sxs/prior Treatment) HPI Comments: Patient presents emergency department with chief complaint of shortness of breath and cough. She has a history of CHF, bronchitis, asthma, and COPD. She states that she has been sick for the past month. She states that she was seen by her primary care provider, who prescribed her a Z-Pak. She states that she improved somewhat, but then her symptoms have progressively worsened. She denies productive cough. She reports intermittent fevers and chills, but has not measured temperature. Today she has received 2 nebulizer treatments from EMS without much improvement. There are no aggravating factors.  The history is provided by the patient. No language interpreter was used.    Past Medical History  Diagnosis Date  . COPD (chronic obstructive pulmonary disease)   . Asthma   . Bipolar disorder, unspecified   . High blood pressure   . PTSD (post-traumatic stress disorder)   . Gastric ulcer   . CHF (congestive heart failure)   . Diabetes mellitus type II, controlled   . Paranoid schizophrenia    Past Surgical History  Procedure Laterality Date  . Tonsillectomy  1968  . Cesarean section  1983  . Back surgery  2003    Dr.Broom, has lumbar spine implants [facility]SR[facility]-3D spinal system  . Cardiac catheterization  2013   Family History  Problem Relation Age of Onset  . Diabetes Maternal Grandmother   . Cancer Maternal Grandmother     breast  . Hyperlipidemia Father   . Hypertension Father   . Cancer Paternal Grandmother     breast   History  Substance Use Topics  . Smoking status: Current Every Day Smoker -- 1.00 packs/day for 45 years    Types: Cigarettes  . Smokeless tobacco: Not on file  .  Alcohol Use: Not on file   OB History    No data available     Review of Systems  Constitutional: Negative for fever and chills.  Respiratory: Positive for cough, shortness of breath and wheezing.   Cardiovascular: Positive for chest pain.  Gastrointestinal: Negative for nausea, vomiting, diarrhea and constipation.  Genitourinary: Negative for dysuria.  All other systems reviewed and are negative.     Allergies  Other; Penicillins; Abilify; and Hydrocodone  Home Medications   Prior to Admission medications   Medication Sig Start Date End Date Taking? Authorizing Provider  albuterol (PROVENTIL HFA;VENTOLIN HFA) 108 (90 BASE) MCG/ACT inhaler Inhale 1 puff into the lungs every 6 (six) hours as needed for wheezing or shortness of breath. 01/10/14   [name] L [name], DO  aspirin EC 81 MG tablet Take 81 mg by mouth daily.    Historical Provider, MD  baclofen (LIORESAL) 10 MG tablet Take 1 tablet (10 mg total) by mouth 3 (three) times daily as needed for muscle spasms. 01/10/14   [name] L [name], DO  butalbital-acetaminophen-caffeine (FIORICET, ESGIC) 50-325-40 MG per tablet Take 1 tablet by mouth 2 (two) times daily as needed for headache.    Historical Provider, MD  FLUoxetine (PROZAC) 20 MG tablet TAKE 1 TABLET BY MOUTH EVERY DAY 05/23/14   [name] L [name], DO  furosemide (LASIX) 20 MG tablet Take 1 tablet (20 mg total) by mouth daily. 01/10/14   [name] L [name], DO  ibuprofen (  ADVIL,MOTRIN) 200 MG tablet Take 600 mg by mouth every 6 (six) hours as needed for moderate pain.    Historical Provider, MD  ipratropium-albuterol (DUONEB) 0.5-2.5 (3) MG/3ML SOLN Take 3 mLs by nebulization every 4 (four) hours as needed (wheezing).    Historical Provider, MD  loratadine (ALLERGY) 10 MG tablet Take 10 mg by mouth daily.    Historical Provider, MD  LORazepam (ATIVAN) 1 MG tablet TAKE 1 TABLET BY MOUTH EVERY 8 HOURS AS NEEDED    [name] L [name], DO  nitroGLYCERIN (NITROLINGUAL) 0.4 MG/SPRAY spray Place 1  spray under the tongue every 5 (five) minutes x 3 doses as needed for chest pain.    Historical Provider, MD  oxyCODONE-acetaminophen (PERCOCET/ROXICET) 5-325 MG per tablet Take 1-2 tablets by mouth every 6 (six) hours as needed for severe pain. 02/20/14   Suzi Roots, MD  pravastatin (PRAVACHOL) 20 MG tablet Take 1 tablet (20 mg total) by mouth daily. 01/10/14   [name] L [name], DO  ranitidine (ACID REDUCER) 150 MG tablet Take 150 mg by mouth as needed for heartburn.    Historical Provider, MD   BP 103/73 mmHg  Pulse 86  Temp(Src) 97.5 F (36.4 C) (Oral)  Resp 18  SpO2 100% Physical Exam  Constitutional: She is oriented to person, place, and time. She appears well-developed and well-nourished.  HENT:  Head: Normocephalic and atraumatic.  Eyes: Conjunctivae and EOM are normal. Pupils are equal, round, and reactive to light.  Neck: Normal range of motion. Neck supple.  Cardiovascular: Normal rate and regular rhythm.  Exam reveals no gallop and no friction rub.   No murmur heard. Pulmonary/Chest: Effort normal. No respiratory distress. She has wheezes. She has no rales. She exhibits no tenderness.  Diffuse wheezing  Abdominal: Soft. Bowel sounds are normal. She exhibits no distension and no mass. There is no tenderness. There is no rebound and no guarding.  Musculoskeletal: Normal range of motion. She exhibits no edema or tenderness.  Neurological: She is alert and oriented to person, place, and time.  Skin: Skin is warm and dry.  Psychiatric: She has a normal mood and affect. Her behavior is normal. Judgment and thought content normal.  Nursing note and vitals reviewed.   ED Course  Procedures (including critical care time) Results for orders placed or performed during the hospital encounter of 05/23/14  Pro b natriuretic peptide (BNP)  Result Value Ref Range   Pro B Natriuretic peptide (BNP) 129.5 (H) 0 - 125 pg/mL  CBC with Differential  Result Value Ref Range   WBC 9.8 4.0 -  10.5 K/uL   RBC 4.82 3.87 - 5.11 MIL/uL   Hemoglobin 13.9 12.0 - 15.0 g/dL   HCT 75.6 43.3 - 29.5 %   MCV 88.8 78.0 - 100.0 fL   MCH 28.8 26.0 - 34.0 pg   MCHC 32.5 30.0 - 36.0 g/dL   RDW 18.8 41.6 - 60.6 %   Platelets 192 150 - 400 K/uL   Neutrophils Relative % 57 43 - 77 %   Neutro Abs 5.6 1.7 - 7.7 K/uL   Lymphocytes Relative 37 12 - 46 %   Lymphs Abs 3.6 0.7 - 4.0 K/uL   Monocytes Relative 4 3 - 12 %   Monocytes Absolute 0.3 0.1 - 1.0 K/uL   Eosinophils Relative 2 0 - 5 %   Eosinophils Absolute 0.2 0.0 - 0.7 K/uL   Basophils Relative 0 0 - 1 %   Basophils Absolute 0.0 0.0 - 0.1  K/uL  Basic metabolic panel  Result Value Ref Range   Sodium 144 137 - 147 mEq/L   Potassium 4.6 3.7 - 5.3 mEq/L   Chloride 104 96 - 112 mEq/L   CO2 27 19 - 32 mEq/L   Glucose, Bld 98 70 - 99 mg/dL   BUN 11 6 - 23 mg/dL   Creatinine, Ser 1.021.01 0.50 - 1.10 mg/dL   Calcium 9.1 8.4 - 72.510.5 mg/dL   GFR calc non Af Amer 61 (L) >90 mL/min   GFR calc Af Amer 71 (L) >90 mL/min   Anion gap 13 5 - 15  I-Stat Troponin[facility])  Result Value Ref Range   Troponin i, poc 0.05 0.00 - 0.08 ng/mL   Comment 3           Dg Chest 2 View  05/23/2014   CLINICAL DATA:  53 year old female with cough and shortness of Breath with intermittent chest pain for 1 month. Initial encounter.  EXAM: CHEST  2 VIEW  COMPARISON:  None.  FINDINGS: Mildly low lung volumes. Normal cardiac size and mediastinal contours. Visualized tracheal air column is within normal limits. Increased interstitial markings diffusely. No pneumothorax or pleural effusion. No consolidation or confluent pulmonary opacity. Degenerative endplate changes in the spine. No acute osseous abnormality identified.  IMPRESSION: Chronic pulmonary interstitial changes suspected. No superimposed acute findings are identified.   Electronically Signed   By: Augusto GambleLee  Hall M.D.   On: 05/23/2014 17:14      EKG Interpretation None      MDM   Final diagnoses:  Cough   COPD exacerbation    Patient with cough, wheeze, shortness of breath. We'll check basic labs, will give breathing treatment, will reassess.  Patient with COPD exacerbation. She has had several nebulizer treatments as well as a continuous nebulizer. She was also given prednisone. She continues to have wheezing, and a short of breath with exertion. She denies active chest pain. Recommend admission to the hospitalist for further treatment.   Roxy Horsemanobert Yliana Gravois, PA-C 05/23/14 2053

## 2014-05-23 NOTE — ED Notes (Signed)
Bed: WA09 Expected date:  Expected time:  Means of arrival:  Comments: SOB 

## 2014-05-23 NOTE — H&P (Addendum)
[facility] History and Physical  Jadia Capers ZOX:[id number] DOB: Oct 29, 1960 DOA: 05/23/2014  Referring physician: ED physician PCP: Bufford Spikes, DO  Specialists:   Chief Complaint: shortness of breath and cough  HPI: Melissa Stanton is a 53 y.o. female with a past medical history L5-S1, COPD, bipolar disorder, hypertension, congestive heart failure, TIA, who presents with a cough and shortness of breath.  Patient reports that she was treated for sinusitis by her PCP with Z-Pak 1 month ago. Some of her symptoms, including runny nose and post nasal drip improved after she completed antibiotic treatment, but her cough persisted, which has been progressively getting worse. She took mucinux and Robitussin without significant help. She does not have sputum production. No fever, chills or chest pain. In the past several days, she started having shortness of breath. She also reports having nausea and gagging when she has severe cough. In the past 3 to 4 days, she developed diarrhea. She has approximately 3-4 times of bowel movements with watery stools. She has mild abdominal pain which is located over left side of abdomen. Patient is a taken Lasix for congestive heart failure, she does not have leg edema.  She was found to have a negative troponin, ProBNP 129.5 on admission. Chest x-ray shows chronic interstitial change without acute issues. She is admitted to inpatient for further evaluation and treatment.   Review of Systems: As presented in the history of presenting illness, rest negative.  Where does patient live? Lives with her fianc at home.  Can patient participate in ADLs? Yes  Allergy:  Allergies  Allergen Reactions  . Other Anaphylaxis and Other (See Comments)    Dioxide   . Penicillins Anaphylaxis  . Abilify [Aripiprazole] Other (See Comments)    Feels like a heart attack  . Hydrocodone Other (See Comments)    Pt flips out for 3 days    Past Medical History   Diagnosis Date  . COPD (chronic obstructive pulmonary disease)   . Asthma   . Bipolar disorder, unspecified   . High blood pressure   . PTSD (post-traumatic stress disorder)   . Gastric ulcer   . CHF (congestive heart failure)   . Diabetes mellitus type II, controlled   . Paranoid schizophrenia     Past Surgical History  Procedure Laterality Date  . Tonsillectomy  1968  . Cesarean section  1983  . Back surgery  2003    Dr.Broom, has lumbar spine implants TSRH-3D spinal system  . Cardiac catheterization  2013    Social History:  reports that she has been smoking Cigarettes.  She has a 45 pack-year smoking history. She does not have any smokeless tobacco history on file. Her alcohol and drug histories are not on file.  Family History:  Family History  Problem Relation Age of Onset  . Diabetes Maternal Grandmother   . Cancer Maternal Grandmother     breast  . Hyperlipidemia Father   . Hypertension Father   . Cancer Paternal Grandmother     breast     Prior to Admission medications   Medication Sig Start Date End Date Taking? Authorizing Provider  albuterol (PROVENTIL HFA;VENTOLIN HFA) 108 (90 BASE) MCG/ACT inhaler Inhale 1 puff into the lungs every 6 (six) hours as needed for wheezing or shortness of breath. Patient taking differently: Inhale 2 puffs into the lungs 2 (two) times daily.  01/10/14  Yes [name] L [name], DO  aspirin EC 81 MG tablet Take 81 mg by mouth daily.  Yes Historical Provider, MD  baclofen (LIORESAL) 10 MG tablet Take 1 tablet (10 mg total) by mouth 3 (three) times daily as needed for muscle spasms. 01/10/14  Yes [name] L [name], DO  butalbital-acetaminophen-caffeine (FIORICET, ESGIC) 50-325-40 MG per tablet Take 1 tablet by mouth 2 (two) times daily as needed for headache.   Yes Historical Provider, MD  carvedilol (COREG) 6.25 MG tablet Take 6.25 mg by mouth 2 (two) times daily.   Yes Historical Provider, MD  Dextromethorphan-Guaifenesin (ROBITUSSIN DM PO)  Take 10 mLs by mouth 3 (three) times daily.   Yes Historical Provider, MD  FLUoxetine (PROZAC) 20 MG capsule Take 20 mg by mouth daily.   Yes Historical Provider, MD  furosemide (LASIX) 20 MG tablet Take 1 tablet (20 mg total) by mouth daily. 01/10/14  Yes [name] L [name], DO  gabapentin (NEURONTIN) 400 MG capsule Take 400 mg by mouth 3 (three) times daily.   Yes Historical Provider, MD  ipratropium-albuterol (DUONEB) 0.5-2.5 (3) MG/3ML SOLN Take 3 mLs by nebulization every 4 (four) hours as needed (wheezing).   Yes Historical Provider, MD  loratadine (ALLERGY) 10 MG tablet Take 10 mg by mouth daily as needed for allergies.    Yes Historical Provider, MD  LORazepam (ATIVAN) 1 MG tablet Take 1 mg by mouth 3 (three) times daily.   Yes Historical Provider, MD  naproxen (NAPROSYN) 500 MG tablet Take 500 mg by mouth 2 (two) times daily.   Yes Historical Provider, MD  nitroGLYCERIN (NITROLINGUAL) 0.4 MG/SPRAY spray Place 1 spray under the tongue every 5 (five) minutes x 3 doses as needed for chest pain.   Yes Historical Provider, MD  omeprazole (PRILOSEC) 20 MG capsule Take 20 mg by mouth daily.   Yes Historical Provider, MD  oxyCODONE-acetaminophen (PERCOCET/ROXICET) 5-325 MG per tablet Take 1-2 tablets by mouth every 6 (six) hours as needed for severe pain. Patient taking differently: Take 1 tablet by mouth 3 (three) times daily.  02/20/14  Yes Suzi RootsKevin E Steinl, MD  pravastatin (PRAVACHOL) 20 MG tablet Take 1 tablet (20 mg total) by mouth daily. 01/10/14  Yes [name] L [name], DO  FLUoxetine (PROZAC) 20 MG tablet TAKE 1 TABLET BY MOUTH EVERY DAY 05/23/14   [name] L [name], DO  ibuprofen (ADVIL,MOTRIN) 200 MG tablet Take 600 mg by mouth every 6 (six) hours as needed for moderate pain.    Historical Provider, MD  LORazepam (ATIVAN) 1 MG tablet TAKE 1 TABLET BY MOUTH EVERY 8 HOURS AS NEEDED    [name] L [name], DO  ranitidine (ACID REDUCER) 150 MG tablet Take 150 mg by mouth as needed for heartburn.    Historical  Provider, MD    Physical Exam: Filed Vitals:   05/23/14 1834 05/23/14 1936 05/23/14 2115 05/23/14 2135  BP:  118/60 120/66 125/62  Pulse:  81 78 71  Temp:  96.6 F (35.9 C) 97.6 F (36.4 C) 97.8 F (36.6 C)  TempSrc:  Axillary Oral Oral  Resp:  24 20 16   Height:    5\' 5"  (1.651 m)  Weight:    173.3 kg (382 lb 0.9 oz)  SpO2: 96% 99% 95% 96%   General: Not in acute distress HEENT:       Eyes: PERRL, EOMI, no scleral icterus       ENT: No discharge from the ears and nose, no pharynx injection, no tonsillar enlargement.        Neck: No JVD, no bruit, no mass felt. Cardiac: S1/S2, RRR, No murmurs, gallops  or rubs Pulm: decreased air movement bilaterally. Diffused wheezing and rhonchi posteriorly. Abd: Soft, nondistended, mild tenderness over L side of abdomen, no rebound pain, no organomegaly, BS present Ext: No edema. 2+DP/PT pulse bilaterally Musculoskeletal: No joint deformities, erythema, or stiffness, ROM full Skin: No rashes.  Neuro: Alert and oriented X3, cranial nerves II-XII grossly intact, muscle strength 5/5 in all extremeties, sensation to light touch intact.  Psych: Patient is not psychotic, no suicidal or hemocidal ideation.  Labs on Admission:  Basic Metabolic Panel:  Recent Labs Lab 05/23/14 1718  NA 144  K 4.6  CL 104  CO2 27  GLUCOSE 98  BUN 11  CREATININE 1.01  CALCIUM 9.1   Liver Function Tests: No results for input(s): AST, ALT, ALKPHOS, BILITOT, PROT, ALBUMIN in the last 168 hours. No results for input(s): LIPASE, AMYLASE in the last 168 hours. No results for input(s): AMMONIA in the last 168 hours. CBC:  Recent Labs Lab 05/23/14 1718  WBC 9.8  NEUTROABS 5.6  HGB 13.9  HCT 42.8  MCV 88.8  PLT 192   Cardiac Enzymes:  Recent Labs Lab 05/23/14 2155  TROPONINI <0.30    BNP (last 3 results)  Recent Labs  05/23/14 1718  PROBNP 129.5*   CBG: No results for input(s): GLUCAP in the last 168 hours.  Radiological Exams on  Admission: Dg Chest 2 View  05/23/2014   CLINICAL DATA:  53 year old female with cough and shortness of Breath with intermittent chest pain for 1 month. Initial encounter.  EXAM: CHEST  2 VIEW  COMPARISON:  None.  FINDINGS: Mildly low lung volumes. Normal cardiac size and mediastinal contours. Visualized tracheal air column is within normal limits. Increased interstitial markings diffusely. No pneumothorax or pleural effusion. No consolidation or confluent pulmonary opacity. Degenerative endplate changes in the spine. No acute osseous abnormality identified.  IMPRESSION: Chronic pulmonary interstitial changes suspected. No superimposed acute findings are identified.   Electronically Signed   By: Augusto GambleLee  Hall M.D.   On: 05/23/2014 17:14    EKG: Independently reviewed.   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 Another potential differential diagnosis is CHF exacerbation, which is less likely given euvolemic status and low proBNP value 129.5 on admission.  -will admit patient to Med-surg bed  -Nebulizers: albuterol and Atrovent  -Solu-Medrol 60 mg q6h   -oral levaquin -EKG and troponin x3  - Codeine for cough  HTN:  Continue coreg  CHF: No 2-D echo record in Epic patient is euvolemic on admission. She is taking Lasix 20 mg daily. -will continue Lasix 20 mg daily  TIA: had three Episode of a TIA. No acute issues on admission -continue aspirin and  Pravastatin  Diarrhea: unclear etiology. Likely due to viral infection,  however given recent history of antibiotic use, we'll check C. Difficile pcr  OSA: CPAP  DVT ppx: SQ  Heparin     Code Status: Full code Family Communication: None at bed side. Disposition Plan: Admit to inpatient   Date of Service 05/23/2014    Lorretta HarpIU, Theadora Noyes [facility] Pager [phone]  If 7PM-7AM, please contact night-coverage [email or web address] Password [email or web address] 05/23/2014, 11:26 PM

## 2014-05-23 NOTE — ED Notes (Signed)
Pt denies CP but reports tightness related to difficulty breathing.

## 2014-05-23 NOTE — ED Notes (Signed)
Patient talking on cell phone with no decrease in O2 or being short of breath.

## 2014-05-23 NOTE — ED Notes (Signed)
Per EMS pt reports worsening SOB over past week with recent diagnosis of bronchitis. On arrival of EMS pt I/E wheezing. Pt given 10 mg of albuterol and 0.5 of Atrovent. Pt currently E wheezing.

## 2014-05-23 NOTE — ED Notes (Signed)
Received report from Karen KitchensMorgan R, Charity fundraiserN.

## 2014-05-23 NOTE — Progress Notes (Signed)
Patient refuses the use of nocturnal CPAP tonight. She states that she has not been able to tolerate her home unit in the past several months either. She agrees to notify RN or RT if she decides to be compliant at a later time tonight. Otherwise, RT will follow up tomorrow. Equipment remains at the bedside for future use.

## 2014-05-24 DIAGNOSIS — F319 Bipolar disorder, unspecified: Secondary | ICD-10-CM

## 2014-05-24 DIAGNOSIS — E119 Type 2 diabetes mellitus without complications: Secondary | ICD-10-CM

## 2014-05-24 LAB — COMPREHENSIVE METABOLIC PANEL
ALK PHOS: 107 U/L (ref 39–117)
ALT: 28 U/L (ref 0–35)
AST: 20 U/L (ref 0–37)
Albumin: 3.7 g/dL (ref 3.5–5.2)
Anion gap: 17 — ABNORMAL HIGH (ref 5–15)
BILIRUBIN TOTAL: 0.3 mg/dL (ref 0.3–1.2)
BUN: 15 mg/dL (ref 6–23)
CHLORIDE: 98 meq/L (ref 96–112)
CO2: 23 meq/L (ref 19–32)
Calcium: 9.7 mg/dL (ref 8.4–10.5)
Creatinine, Ser: 0.88 mg/dL (ref 0.50–1.10)
GFR, EST AFRICAN AMERICAN: 84 mL/min — AB (ref >90)
GFR, EST NON AFRICAN AMERICAN: 73 mL/min — AB (ref >90)
GLUCOSE: 169 mg/dL — AB (ref 70–99)
POTASSIUM: 4.2 meq/L (ref 3.7–5.3)
SODIUM: 138 meq/L (ref 137–147)
Total Protein: 7.7 g/dL (ref 6.0–8.3)

## 2014-05-24 LAB — CBC
HCT: 43.9 % (ref 36.0–46.0)
Hemoglobin: 14.1 g/dL (ref 12.0–15.0)
MCH: 28.1 pg (ref 26.0–34.0)
MCHC: 32.1 g/dL (ref 30.0–36.0)
MCV: 87.5 fL (ref 78.0–100.0)
PLATELETS: 221 10*3/uL (ref 150–400)
RBC: 5.02 MIL/uL (ref 3.87–5.11)
RDW: 14.3 % (ref 11.5–15.5)
WBC: 8.9 10*3/uL (ref 4.0–10.5)

## 2014-05-24 LAB — TROPONIN I

## 2014-05-24 MED ORDER — ONDANSETRON HCL 4 MG/2ML IJ SOLN
4.0000 mg | Freq: Once | INTRAMUSCULAR | Status: AC
  Administered 2014-05-24: 4 mg via INTRAVENOUS
  Filled 2014-05-24: qty 2

## 2014-05-24 MED ORDER — ONDANSETRON HCL 4 MG PO TABS: 4.0000 mg | ORAL_TABLET | Freq: Once | ORAL | Status: DC

## 2014-05-24 MED ORDER — OXYCODONE-ACETAMINOPHEN 5-325 MG PO TABS: 1.0000 | ORAL_TABLET | Freq: Four times a day (QID) | ORAL | Status: AC | PRN

## 2014-05-24 MED ORDER — PREDNISONE 50 MG PO TABS: ORAL_TABLET | ORAL | Status: AC

## 2014-05-24 MED ORDER — IPRATROPIUM-ALBUTEROL 0.5-2.5 (3) MG/3ML IN SOLN: 3.0000 mL | RESPIRATORY_TRACT | Status: AC | PRN

## 2014-05-24 MED ORDER — LEVOFLOXACIN 750 MG PO TABS: 750.0000 mg | ORAL_TABLET | ORAL | Status: AC

## 2014-05-24 NOTE — Discharge Summary (Signed)
Physician Discharge Summary  Melissa Stanton ZOX:[id number] DOB: [birth year] DOA: 05/23/2014  PCP: Bufford SpikesEED, [name], DO  Admit date: 05/23/2014 Discharge date: 05/24/2014  Time spent: > 35 minutes  Recommendations for Outpatient Follow-up:  1. Consider prolonging antibiotic course pending clinical condition on post discharge follow-up 2. Renewed patient's Atrovent and ipratropium prescription as she reports she was almost out and prefers the nebulizers. 3. Will provide prednisone for a short course of 5 days. Has received 1 day while at the hospital. She will get 4 more days of prednisone 50 mg daily.  Discharge Diagnoses: Please see list below  Discharge Condition: stable  Diet recommendation: diabetic diet  Filed Weights   05/23/14 2135 05/24/14 0516  Weight: 107.3 kg (236 lb 8.9 oz) 107.3 kg (236 lb 8.9 oz)    History of present illness:  53 y/o with h/o bipolar disorder, COPD, PTSD, chronic low back pain, OSA on C Pap. Who presented with acute COPD exacerbation.  Hospital Course:  Principal Problem:   COPD exacerbation - improved with antibiotics, bronchodilators, steroids - Breathing comfortably on room air on day of discharge  Active Problems:   Bipolar disorder - stable patient to continue home regimen on discharge    Essential hypertension, benign - stable patient to continue home regimen on discharge    Chronic low back pain -stable no new complaints while in house     Diabetes mellitus type II, controlled - patient to continue home regimen on discharge - diabetic diet    Diarrhea - Resolved no bouts of diarrhea reported by nursing on day of discharge  Procedures:  none  Consultations:  None  Discharge Exam: Filed Vitals:   05/24/14 0516  BP: 143/74  Pulse: 86  Temp: 97.5 F (36.4 C)  Resp: 18    General: Pt in nad, alert and awake Cardiovascular: rrr, no mrg Respiratory: prolonged expiratory phase, clear to auscultation, no  wheezes  Discharge Instructions You were cared for by a hospitalist during your hospital stay. If you have any questions about your discharge medications or the care you received while you were in the hospital after you are discharged, you can call the unit and asked to speak with the hospitalist on call if the hospitalist that took care of you is not available. Once you are discharged, your primary care physician will handle any further medical issues. Please note that NO REFILLS for any discharge medications will be authorized once you are discharged, as it is imperative that you return to your primary care physician (or establish a relationship with a primary care physician if you do not have one) for your aftercare needs so that they can reassess your need for medications and monitor your lab values.  Discharge Instructions    Call MD for:  difficulty breathing, headache or visual disturbances    Complete by:  As directed      Call MD for:  extreme fatigue    Complete by:  As directed      Call MD for:  temperature >100.4    Complete by:  As directed      Diet - low sodium heart healthy    Complete by:  As directed      Discharge instructions    Complete by:  As directed   Please follow up with your primary care physician tomorrow. Should any new questions arise regarding your medication please discuss with your pharmacist.     Increase activity slowly    Complete by:  As  directed           Current Discharge Medication List    START taking these medications   Details  levofloxacin (LEVAQUIN) 750 MG tablet Take 1 tablet (750 mg total) by mouth daily. Qty: 2 tablet, Refills: 0    ondansetron (ZOFRAN) 4 MG tablet Take 1 tablet (4 mg total) by mouth once. Qty: 20 tablet, Refills: 0    predniSONE (DELTASONE) 50 MG tablet Take one tablet by mouth daily. Qty: 4 tablet, Refills: 0      CONTINUE these medications which have CHANGED   Details  ipratropium-albuterol (DUONEB) 0.5-2.5  (3) MG/3ML SOLN Take 3 mLs by nebulization every 4 (four) hours as needed (wheezing). Qty: 360 mL, Refills: 0    oxyCODONE-acetaminophen (PERCOCET/ROXICET) 5-325 MG per tablet Take 1-2 tablets by mouth every 6 (six) hours as needed for severe pain. Qty: 20 tablet, Refills: 0      CONTINUE these medications which have NOT CHANGED   Details  aspirin EC 81 MG tablet Take 81 mg by mouth daily.    baclofen (LIORESAL) 10 MG tablet Take 1 tablet (10 mg total) by mouth 3 (three) times daily as needed for muscle spasms. Qty: 90 each, Refills: 3    butalbital-acetaminophen-caffeine (FIORICET, ESGIC) 50-325-40 MG per tablet Take 1 tablet by mouth 2 (two) times daily as needed for headache.    carvedilol (COREG) 6.25 MG tablet Take 6.25 mg by mouth 2 (two) times daily.    Dextromethorphan-Guaifenesin (ROBITUSSIN DM PO) Take 10 mLs by mouth 3 (three) times daily.    FLUoxetine (PROZAC) 20 MG capsule Take 20 mg by mouth daily.    furosemide (LASIX) 20 MG tablet Take 1 tablet (20 mg total) by mouth daily. Qty: 30 tablet, Refills: 3    gabapentin (NEURONTIN) 400 MG capsule Take 400 mg by mouth 3 (three) times daily.    loratadine (ALLERGY) 10 MG tablet Take 10 mg by mouth daily as needed for allergies.     !! LORazepam (ATIVAN) 1 MG tablet Take 1 mg by mouth 3 (three) times daily.    naproxen (NAPROSYN) 500 MG tablet Take 500 mg by mouth 2 (two) times daily.    nitroGLYCERIN (NITROLINGUAL) 0.4 MG/SPRAY spray Place 1 spray under the tongue every 5 (five) minutes x 3 doses as needed for chest pain.    omeprazole (PRILOSEC) 20 MG capsule Take 20 mg by mouth daily.    pravastatin (PRAVACHOL) 20 MG tablet Take 1 tablet (20 mg total) by mouth daily. Qty: 30 tablet, Refills: 3    FLUoxetine (PROZAC) 20 MG tablet TAKE 1 TABLET BY MOUTH EVERY DAY Qty: 30 tablet, Refills: 0    !! LORazepam (ATIVAN) 1 MG tablet TAKE 1 TABLET BY MOUTH EVERY 8 HOURS AS NEEDED Qty: 90 tablet, Refills: 0    ranitidine  (ACID REDUCER) 150 MG tablet Take 150 mg by mouth as needed for heartburn.     !! - Potential duplicate medications found. Please discuss with provider.    STOP taking these medications     albuterol (PROVENTIL HFA;VENTOLIN HFA) 108 (90 BASE) MCG/ACT inhaler      ibuprofen (ADVIL,MOTRIN) 200 MG tablet        Allergies  Allergen Reactions  . Other Anaphylaxis and Other (See Comments)    Dioxide   . Penicillins Anaphylaxis  . Abilify [Aripiprazole] Other (See Comments)    Feels like a heart attack  . Hydrocodone Other (See Comments)    Pt flips out for 3 days  The results of significant diagnostics from this hospitalization (including imaging, microbiology, ancillary and laboratory) are listed below for reference.    Significant Diagnostic Studies: Dg Chest 2 View  05/23/2014   CLINICAL DATA:  53 year old female with cough and shortness of Breath with intermittent chest pain for 1 month. Initial encounter.  EXAM: CHEST  2 VIEW  COMPARISON:  None.  FINDINGS: Mildly low lung volumes. Normal cardiac size and mediastinal contours. Visualized tracheal air column is within normal limits. Increased interstitial markings diffusely. No pneumothorax or pleural effusion. No consolidation or confluent pulmonary opacity. Degenerative endplate changes in the spine. No acute osseous abnormality identified.  IMPRESSION: Chronic pulmonary interstitial changes suspected. No superimposed acute findings are identified.   Electronically Signed   By: Augusto Gamble M.D.   On: 05/23/2014 17:14    Microbiology: No results found for this or any previous visit (from the past 240 hour(s)).   Labs: Basic Metabolic Panel:  Recent Labs Lab 05/23/14 1718 05/24/14 0345  NA 144 138  K 4.6 4.2  CL 104 98  CO2 27 23  GLUCOSE 98 169*  BUN 11 15  CREATININE 1.01 0.88  CALCIUM 9.1 9.7   Liver Function Tests:  Recent Labs Lab 05/24/14 0345  AST 20  ALT 28  ALKPHOS 107  BILITOT 0.3  PROT 7.7   ALBUMIN 3.7   No results for input(s): LIPASE, AMYLASE in the last 168 hours. No results for input(s): AMMONIA in the last 168 hours. CBC:  Recent Labs Lab 05/23/14 1718 05/24/14 0345  WBC 9.8 8.9  NEUTROABS 5.6  --   HGB 13.9 14.1  HCT 42.8 43.9  MCV 88.8 87.5  PLT 192 221   Cardiac Enzymes:  Recent Labs Lab 05/23/14 2155 05/24/14 0345  TROPONINI <0.30 <0.30   BNP: BNP (last 3 results)  Recent Labs  05/23/14 1718  PROBNP 129.5*   CBG: No results for input(s): GLUCAP in the last 168 hours.     Signed:  Penny Pia  [facility] 05/24/2014, 9:01 AM

## 2014-05-24 NOTE — Plan of Care (Signed)
Problem: Phase I Progression Outcomes Goal: Hemodynamically stable Outcome: Completed/Met Date Met:  05/24/14     

## 2014-05-24 NOTE — Plan of Care (Signed)
Problem: Phase I Progression Outcomes Goal: O2 sats > or equal 90% or at baseline Outcome: Completed/Met Date Met:  05/24/14

## 2014-05-26 LAB — RESPIRATORY VIRUS PANEL
ADENOVIRUS: NOT DETECTED
INFLUENZA A H1: NOT DETECTED
INFLUENZA A: NOT DETECTED
Influenza A H3: NOT DETECTED
Influenza B: NOT DETECTED
METAPNEUMOVIRUS: NOT DETECTED
PARAINFLUENZA 2 A: NOT DETECTED
PARAINFLUENZA 3 A: NOT DETECTED
Parainfluenza 1: NOT DETECTED
Respiratory Syncytial Virus A: NOT DETECTED
Respiratory Syncytial Virus B: NOT DETECTED
Rhinovirus: DETECTED — AB

## 2014-06-16 ENCOUNTER — Emergency Department (HOSPITAL_COMMUNITY): Payer: MEDICARE

## 2014-06-16 ENCOUNTER — Inpatient Hospital Stay (HOSPITAL_COMMUNITY)
Admission: EM | Admit: 2014-06-16 | Discharge: 2014-06-19 | DRG: 683 | Disposition: A | Discharge status: Home / Self Care | Payer: MEDICARE | Attending: Internal Medicine | Admitting: Internal Medicine

## 2014-06-16 ENCOUNTER — Encounter (HOSPITAL_COMMUNITY): Payer: Self-pay | Admitting: Emergency Medicine

## 2014-06-16 DIAGNOSIS — E872 Acidosis: Secondary | ICD-10-CM | POA: Diagnosis present

## 2014-06-16 DIAGNOSIS — Z79891 Long term (current) use of opiate analgesic: Secondary | ICD-10-CM

## 2014-06-16 DIAGNOSIS — R0602 Shortness of breath: Secondary | ICD-10-CM

## 2014-06-16 DIAGNOSIS — E860 Dehydration: Secondary | ICD-10-CM

## 2014-06-16 DIAGNOSIS — R197 Diarrhea, unspecified: Secondary | ICD-10-CM

## 2014-06-16 DIAGNOSIS — F200 Paranoid schizophrenia: Secondary | ICD-10-CM | POA: Diagnosis present

## 2014-06-16 DIAGNOSIS — A059 Bacterial foodborne intoxication, unspecified: Secondary | ICD-10-CM | POA: Diagnosis present

## 2014-06-16 DIAGNOSIS — G4733 Obstructive sleep apnea (adult) (pediatric): Secondary | ICD-10-CM | POA: Diagnosis present

## 2014-06-16 DIAGNOSIS — I10 Essential (primary) hypertension: Secondary | ICD-10-CM | POA: Diagnosis present

## 2014-06-16 DIAGNOSIS — Z7982 Long term (current) use of aspirin: Secondary | ICD-10-CM | POA: Diagnosis not present

## 2014-06-16 DIAGNOSIS — R112 Nausea with vomiting, unspecified: Secondary | ICD-10-CM | POA: Diagnosis present

## 2014-06-16 DIAGNOSIS — L509 Urticaria, unspecified: Secondary | ICD-10-CM | POA: Diagnosis present

## 2014-06-16 DIAGNOSIS — F319 Bipolar disorder, unspecified: Secondary | ICD-10-CM | POA: Diagnosis present

## 2014-06-16 DIAGNOSIS — J45909 Unspecified asthma, uncomplicated: Secondary | ICD-10-CM | POA: Diagnosis present

## 2014-06-16 DIAGNOSIS — Z8711 Personal history of peptic ulcer disease: Secondary | ICD-10-CM

## 2014-06-16 DIAGNOSIS — I509 Heart failure, unspecified: Secondary | ICD-10-CM | POA: Diagnosis present

## 2014-06-16 DIAGNOSIS — Z8673 Personal history of transient ischemic attack (TIA), and cerebral infarction without residual deficits: Secondary | ICD-10-CM | POA: Diagnosis not present

## 2014-06-16 DIAGNOSIS — Z6841 Body Mass Index (BMI) 40.0 and over, adult: Secondary | ICD-10-CM | POA: Diagnosis not present

## 2014-06-16 DIAGNOSIS — N179 Acute kidney failure, unspecified: Principal | ICD-10-CM | POA: Diagnosis present

## 2014-06-16 DIAGNOSIS — K219 Gastro-esophageal reflux disease without esophagitis: Secondary | ICD-10-CM | POA: Diagnosis present

## 2014-06-16 DIAGNOSIS — E119 Type 2 diabetes mellitus without complications: Secondary | ICD-10-CM | POA: Diagnosis present

## 2014-06-16 DIAGNOSIS — F17210 Nicotine dependence, cigarettes, uncomplicated: Secondary | ICD-10-CM | POA: Diagnosis present

## 2014-06-16 DIAGNOSIS — R21 Rash and other nonspecific skin eruption: Secondary | ICD-10-CM

## 2014-06-16 DIAGNOSIS — N289 Disorder of kidney and ureter, unspecified: Secondary | ICD-10-CM

## 2014-06-16 DIAGNOSIS — F4310 Post-traumatic stress disorder, unspecified: Secondary | ICD-10-CM | POA: Diagnosis present

## 2014-06-16 DIAGNOSIS — Z79899 Other long term (current) drug therapy: Secondary | ICD-10-CM

## 2014-06-16 DIAGNOSIS — J441 Chronic obstructive pulmonary disease with (acute) exacerbation: Secondary | ICD-10-CM | POA: Diagnosis present

## 2014-06-16 DIAGNOSIS — R0902 Hypoxemia: Secondary | ICD-10-CM | POA: Diagnosis present

## 2014-06-16 HISTORY — DX: Headache: R51

## 2014-06-16 HISTORY — DX: Gastro-esophageal reflux disease without esophagitis: K21.9

## 2014-06-16 HISTORY — DX: Other injury of unspecified body region, initial encounter: T14.8XXA

## 2014-06-16 HISTORY — DX: Headache, unspecified: R51.9

## 2014-06-16 HISTORY — DX: Transient cerebral ischemic attack, unspecified: G45.9

## 2014-06-16 HISTORY — DX: Sleep apnea, unspecified: G47.30

## 2014-06-16 HISTORY — DX: Reserved for inherently not codable concepts without codable children: IMO0001

## 2014-06-16 LAB — CBC
HCT: 49.8 % — ABNORMAL HIGH (ref 36.0–46.0)
Hemoglobin: 17 g/dL — ABNORMAL HIGH (ref 12.0–15.0)
MCH: 29.1 pg (ref 26.0–34.0)
MCHC: 34.1 g/dL (ref 30.0–36.0)
MCV: 85.3 fL (ref 78.0–100.0)
Platelets: 257 10*3/uL (ref 150–400)
RBC: 5.84 MIL/uL — ABNORMAL HIGH (ref 3.87–5.11)
RDW: 15.2 % (ref 11.5–15.5)
WBC: 6.8 10*3/uL (ref 4.0–10.5)

## 2014-06-16 LAB — CBC WITH DIFFERENTIAL/PLATELET
Basophils Absolute: 0 10*3/uL (ref 0.0–0.1)
Basophils Relative: 0 % (ref 0–1)
Eosinophils Absolute: 0 10*3/uL (ref 0.0–0.7)
Eosinophils Relative: 0 % (ref 0–5)
HCT: 55.7 % — ABNORMAL HIGH (ref 36.0–46.0)
HEMOGLOBIN: 19.5 g/dL — AB (ref 12.0–15.0)
LYMPHS ABS: 1.9 10*3/uL (ref 0.7–4.0)
LYMPHS PCT: 22 % (ref 12–46)
MCH: 30.1 pg (ref 26.0–34.0)
MCHC: 35 g/dL (ref 30.0–36.0)
MCV: 86.1 fL (ref 78.0–100.0)
MONOS PCT: 6 % (ref 3–12)
Monocytes Absolute: 0.5 10*3/uL (ref 0.1–1.0)
NEUTROS ABS: 6 10*3/uL (ref 1.7–7.7)
NEUTROS PCT: 72 % (ref 43–77)
PLATELETS: 285 10*3/uL (ref 150–400)
RBC: 6.47 MIL/uL — AB (ref 3.87–5.11)
RDW: 15.2 % (ref 11.5–15.5)
WBC: 8.4 10*3/uL (ref 4.0–10.5)

## 2014-06-16 LAB — TROPONIN I: Troponin I: <0.3 ng/mL (ref <0.30)

## 2014-06-16 LAB — COMPREHENSIVE METABOLIC PANEL
ALK PHOS: 78 U/L (ref 39–117)
ALT: 21 U/L (ref 0–35)
AST: 34 U/L (ref 0–37)
Albumin: 3.6 g/dL (ref 3.5–5.2)
Anion gap: 21 — ABNORMAL HIGH (ref 5–15)
BUN: 31 mg/dL — AB (ref 6–23)
CO2: 18 meq/L — AB (ref 19–32)
Calcium: 9.6 mg/dL (ref 8.4–10.5)
Chloride: 96 mEq/L (ref 96–112)
Creatinine, Ser: 2.17 mg/dL — ABNORMAL HIGH (ref 0.50–1.10)
GFR calc Af Amer: 28 mL/min — ABNORMAL LOW (ref >90)
GFR calc non Af Amer: 24 mL/min — ABNORMAL LOW (ref >90)
Glucose, Bld: 147 mg/dL — ABNORMAL HIGH (ref 70–99)
POTASSIUM: 4.9 meq/L (ref 3.7–5.3)
SODIUM: 135 meq/L — AB (ref 137–147)
TOTAL PROTEIN: 7.4 g/dL (ref 6.0–8.3)
Total Bilirubin: 0.8 mg/dL (ref 0.3–1.2)

## 2014-06-16 LAB — CREATININE, SERUM
Creatinine, Ser: 2.21 mg/dL — ABNORMAL HIGH (ref 0.50–1.10)
GFR calc Af Amer: 28 mL/min — ABNORMAL LOW (ref >90)
GFR calc non Af Amer: 24 mL/min — ABNORMAL LOW (ref >90)

## 2014-06-16 LAB — GLUCOSE, CAPILLARY
GLUCOSE-CAPILLARY: 138 mg/dL — AB (ref 70–99)
Glucose-Capillary: 156 mg/dL — ABNORMAL HIGH (ref 70–99)
Glucose-Capillary: 163 mg/dL — ABNORMAL HIGH (ref 70–99)

## 2014-06-16 LAB — C-REACTIVE PROTEIN: CRP: 25.7 mg/dL — ABNORMAL HIGH (ref <0.60)

## 2014-06-16 LAB — SEDIMENTATION RATE: Sed Rate: 2 mm/hr (ref 0–22)

## 2014-06-16 MED ORDER — DIPHENHYDRAMINE HCL 50 MG/ML IJ SOLN: 25.0000 mg | Freq: Four times a day (QID) | INTRAMUSCULAR | Status: DC | PRN

## 2014-06-16 MED ORDER — PROMETHAZINE HCL 25 MG/ML IJ SOLN: 25.0000 mg | Freq: Four times a day (QID) | INTRAMUSCULAR | Status: DC | PRN

## 2014-06-16 MED ORDER — HYDROMORPHONE HCL 1 MG/ML IJ SOLN: 1.0000 mg | INTRAMUSCULAR | Status: DC | PRN

## 2014-06-16 MED ORDER — ASPIRIN EC 81 MG PO TBEC
81.0000 mg | DELAYED_RELEASE_TABLET | Freq: Every day | ORAL | Status: DC
  Administered 2014-06-16 – 2014-06-19 (×4): 81 mg via ORAL
  Filled 2014-06-16 (×4): qty 1

## 2014-06-16 MED ORDER — BACLOFEN 10 MG PO TABS
10.0000 mg | ORAL_TABLET | Freq: Three times a day (TID) | ORAL | Status: DC | PRN
  Filled 2014-06-16: qty 1

## 2014-06-16 MED ORDER — LORATADINE 10 MG PO TABS
10.0000 mg | ORAL_TABLET | Freq: Every day | ORAL | Status: DC
  Administered 2014-06-16 – 2014-06-19 (×4): 10 mg via ORAL
  Filled 2014-06-16 (×4): qty 1

## 2014-06-16 MED ORDER — GABAPENTIN 400 MG PO CAPS
400.0000 mg | ORAL_CAPSULE | Freq: Three times a day (TID) | ORAL | Status: DC
  Administered 2014-06-16 – 2014-06-19 (×9): 400 mg via ORAL
  Filled 2014-06-16 (×11): qty 1

## 2014-06-16 MED ORDER — SODIUM CHLORIDE 0.9 % IV SOLN
INTRAVENOUS | Status: DC
  Administered 2014-06-16 – 2014-06-17 (×2): via INTRAVENOUS

## 2014-06-16 MED ORDER — GUAIFENESIN-DM 100-10 MG/5ML PO SYRP
5.0000 mL | ORAL_SOLUTION | ORAL | Status: DC | PRN
  Filled 2014-06-16: qty 5

## 2014-06-16 MED ORDER — ALUM & MAG HYDROXIDE-SIMETH 200-200-20 MG/5ML PO SUSP: 30.0000 mL | Freq: Four times a day (QID) | ORAL | Status: DC | PRN

## 2014-06-16 MED ORDER — LEVALBUTEROL HCL 0.63 MG/3ML IN NEBU
0.6300 mg | INHALATION_SOLUTION | Freq: Four times a day (QID) | RESPIRATORY_TRACT | Status: DC
Start: 2014-06-16 — End: 2014-06-16

## 2014-06-16 MED ORDER — FAMOTIDINE IN NACL 20-0.9 MG/50ML-% IV SOLN
20.0000 mg | Freq: Once | INTRAVENOUS | Status: AC
  Administered 2014-06-16: 20 mg via INTRAVENOUS
  Filled 2014-06-16: qty 50

## 2014-06-16 MED ORDER — GI COCKTAIL ~~LOC~~
30.0000 mL | Freq: Once | ORAL | Status: AC
  Administered 2014-06-16: 30 mL via ORAL
  Filled 2014-06-16: qty 30

## 2014-06-16 MED ORDER — NITROGLYCERIN 0.4 MG/SPRAY TL SOLN: 1.0000 | Status: DC | PRN

## 2014-06-16 MED ORDER — ACETAMINOPHEN 325 MG PO TABS: 650.0000 mg | ORAL_TABLET | Freq: Four times a day (QID) | ORAL | Status: DC | PRN

## 2014-06-16 MED ORDER — ACETAMINOPHEN 650 MG RE SUPP: 650.0000 mg | Freq: Four times a day (QID) | RECTAL | Status: DC | PRN

## 2014-06-16 MED ORDER — DIPHENHYDRAMINE HCL 50 MG/ML IJ SOLN
25.0000 mg | Freq: Once | INTRAMUSCULAR | Status: AC
  Administered 2014-06-16: 25 mg via INTRAVENOUS
  Filled 2014-06-16: qty 1

## 2014-06-16 MED ORDER — METRONIDAZOLE IN NACL 5-0.79 MG/ML-% IV SOLN
500.0000 mg | Freq: Three times a day (TID) | INTRAVENOUS | Status: DC
  Filled 2014-06-16 (×3): qty 100

## 2014-06-16 MED ORDER — ONDANSETRON HCL 4 MG/2ML IJ SOLN
4.0000 mg | Freq: Once | INTRAMUSCULAR | Status: AC
  Administered 2014-06-16: 4 mg via INTRAVENOUS
  Filled 2014-06-16: qty 2

## 2014-06-16 MED ORDER — SODIUM CHLORIDE 0.9 % IJ SOLN
3.0000 mL | Freq: Two times a day (BID) | INTRAMUSCULAR | Status: DC
  Administered 2014-06-18 (×2): 3 mL via INTRAVENOUS

## 2014-06-16 MED ORDER — CETYLPYRIDINIUM CHLORIDE 0.05 % MT LIQD
7.0000 mL | Freq: Two times a day (BID) | OROMUCOSAL | Status: DC
  Administered 2014-06-16 – 2014-06-19 (×4): 7 mL via OROMUCOSAL

## 2014-06-16 MED ORDER — ONDANSETRON HCL 4 MG/2ML IJ SOLN
4.0000 mg | Freq: Four times a day (QID) | INTRAMUSCULAR | Status: AC
  Administered 2014-06-16 – 2014-06-17 (×4): 4 mg via INTRAVENOUS
  Filled 2014-06-16 (×4): qty 2

## 2014-06-16 MED ORDER — IPRATROPIUM-ALBUTEROL 0.5-2.5 (3) MG/3ML IN SOLN
3.0000 mL | Freq: Four times a day (QID) | RESPIRATORY_TRACT | Status: DC
  Administered 2014-06-16 – 2014-06-19 (×11): 3 mL via RESPIRATORY_TRACT
  Filled 2014-06-16 (×11): qty 3

## 2014-06-16 MED ORDER — HEPARIN SODIUM (PORCINE) 5000 UNIT/ML IJ SOLN
5000.0000 [IU] | Freq: Three times a day (TID) | INTRAMUSCULAR | Status: DC
  Administered 2014-06-16 – 2014-06-19 (×9): 5000 [IU] via SUBCUTANEOUS
  Filled 2014-06-16 (×11): qty 1

## 2014-06-16 MED ORDER — METHYLPREDNISOLONE SODIUM SUCC 125 MG IJ SOLR
60.0000 mg | Freq: Two times a day (BID) | INTRAMUSCULAR | Status: DC
  Administered 2014-06-16 – 2014-06-17 (×2): 60 mg via INTRAVENOUS
  Filled 2014-06-16: qty 0.96
  Filled 2014-06-16: qty 2
  Filled 2014-06-16 (×4): qty 0.96

## 2014-06-16 MED ORDER — METHYLPREDNISOLONE SODIUM SUCC 125 MG IJ SOLR
125.0000 mg | Freq: Once | INTRAMUSCULAR | Status: AC
  Administered 2014-06-16: 125 mg via INTRAVENOUS
  Filled 2014-06-16: qty 2

## 2014-06-16 MED ORDER — METOCLOPRAMIDE HCL 5 MG/ML IJ SOLN
10.0000 mg | Freq: Once | INTRAMUSCULAR | Status: AC
  Administered 2014-06-16: 10 mg via INTRAVENOUS
  Filled 2014-06-16: qty 2

## 2014-06-16 MED ORDER — LORAZEPAM 1 MG PO TABS
1.0000 mg | ORAL_TABLET | Freq: Three times a day (TID) | ORAL | Status: DC
  Administered 2014-06-16 – 2014-06-17 (×5): 1 mg via ORAL
  Filled 2014-06-16 (×5): qty 1

## 2014-06-16 MED ORDER — OXYCODONE-ACETAMINOPHEN 5-325 MG PO TABS: 1.0000 | ORAL_TABLET | Freq: Four times a day (QID) | ORAL | Status: DC | PRN

## 2014-06-16 MED ORDER — FAMOTIDINE IN NACL 20-0.9 MG/50ML-% IV SOLN
20.0000 mg | Freq: Two times a day (BID) | INTRAVENOUS | Status: DC
  Administered 2014-06-16: 20 mg via INTRAVENOUS
  Filled 2014-06-16 (×4): qty 50

## 2014-06-16 MED ORDER — CARVEDILOL 6.25 MG PO TABS
6.2500 mg | ORAL_TABLET | Freq: Two times a day (BID) | ORAL | Status: DC
  Filled 2014-06-16: qty 1

## 2014-06-16 MED ORDER — IPRATROPIUM BROMIDE 0.02 % IN SOLN: 0.5000 mg | Freq: Four times a day (QID) | RESPIRATORY_TRACT | Status: DC

## 2014-06-16 MED ORDER — INSULIN ASPART 100 UNIT/ML ~~LOC~~ SOLN
0.0000 [IU] | Freq: Three times a day (TID) | SUBCUTANEOUS | Status: DC
  Administered 2014-06-16: 2 [IU] via SUBCUTANEOUS
  Administered 2014-06-17 – 2014-06-18 (×4): 1 [IU] via SUBCUTANEOUS
  Administered 2014-06-18: 2 [IU] via SUBCUTANEOUS

## 2014-06-16 MED ORDER — SODIUM CHLORIDE 0.9 % IV SOLN
INTRAVENOUS | Status: DC
  Administered 2014-06-16 (×2): via INTRAVENOUS

## 2014-06-16 MED ORDER — INSULIN ASPART 100 UNIT/ML ~~LOC~~ SOLN: 0.0000 [IU] | Freq: Every day | SUBCUTANEOUS | Status: DC

## 2014-06-16 MED ORDER — NITROGLYCERIN 0.4 MG SL SUBL
0.4000 mg | SUBLINGUAL_TABLET | SUBLINGUAL | Status: DC | PRN
  Filled 2014-06-16: qty 1

## 2014-06-16 MED ORDER — FLUOXETINE HCL 20 MG PO CAPS
20.0000 mg | ORAL_CAPSULE | Freq: Every day | ORAL | Status: DC
  Administered 2014-06-16 – 2014-06-19 (×4): 20 mg via ORAL
  Filled 2014-06-16 (×4): qty 1

## 2014-06-16 MED ORDER — CARVEDILOL 6.25 MG PO TABS
6.2500 mg | ORAL_TABLET | Freq: Two times a day (BID) | ORAL | Status: DC
  Administered 2014-06-16 – 2014-06-19 (×7): 6.25 mg via ORAL
  Filled 2014-06-16 (×8): qty 1

## 2014-06-16 MED ORDER — ALBUTEROL SULFATE (2.5 MG/3ML) 0.083% IN NEBU
5.0000 mg | INHALATION_SOLUTION | Freq: Once | RESPIRATORY_TRACT | Status: AC
  Administered 2014-06-16: 5 mg via RESPIRATORY_TRACT
  Filled 2014-06-16: qty 6

## 2014-06-16 NOTE — Progress Notes (Signed)
Per MD Rai order.  Begin Coreg 6.25 mg now.

## 2014-06-16 NOTE — ED Notes (Signed)
Received pt from home with c/o ate at [employer] 3 days ago and developed hives. Pt has tried over the counter benadryl without relief. Pt seen by PMD yesterday and given prednisone, phenergan and bactrim. Pulse ox 80% on room air for EMS. Pulse ox 87% here in ED on room air. Pt with hives all over her body.

## 2014-06-16 NOTE — Consult Note (Signed)
[facility]    Date of Admission:  06/16/2014   Total days of antibiotics 2               Reason for Consult: Acute urticaria, nausea, vomiting and diarrhea    Referring Physician: Dr. Thad Rangeripudeep Rai Primary Care Physician: Dr. Quitman LivingsSami Hassan  Active Problems:   Urticaria   Diarrhea   Nausea & vomiting   Acute kidney injury   Essential hypertension, benign   Severe obesity (BMI >= 40)   Diabetes mellitus type II, controlled   COPD exacerbation   . aspirin EC  81 mg Oral Daily  . carvedilol  6.25 mg Oral BID  . famotidine (PEPCID) IV  20 mg Intravenous Q12H  . FLUoxetine  20 mg Oral Daily  . gabapentin  400 mg Oral TID  . heparin  5,000 Units Subcutaneous 3 times per day  . insulin aspart  0-5 Units Subcutaneous QHS  . insulin aspart  0-9 Units Subcutaneous TID WC  . ipratropium-albuterol  3 mL Nebulization QID  . loratadine  10 mg Oral Daily  . LORazepam  1 mg Oral TID  . methylPREDNISolone (SOLU-MEDROL) injection  60 mg Intravenous Q12H  . ondansetron (ZOFRAN) IV  4 mg Intravenous 4 times per day  . sodium chloride  3 mL Intravenous Q12H    Recommendations: 1. Agree with current management of urticaria 2. Observe off antibiotics for now 3. C. Difficile PCR and GI pathogen panel  4. IV hydration  Assessment: Ms. [name] has acute urticaria with associated nausea, vomiting and diarrhea. I suspect that she has some type of allergic, hypersensitivity reaction and would manage urticaria as you are doing. She is certainly at risk for C. Difficile colitis given recent antibiotic exposure. A variety of viral, bacterial and parasitic infections have been associated with urticaria. I favor holding off on empiric antibiotic therapy at this time. She is dehydrated with acute renal insufficiency and hemoconcentration.    HPI: Melissa Stanton is a 53 y.o. female with multiple medical problems who developed sudden onset of intensely pruritic  red rash 3 days ago. She then began to develop nausea, vomiting and watery diarrhea. She has had some chills but no fever or sweats. She's had no sick contacts that she is aware of. She had one episode of urticaria about 8 years ago. She recalls her doctors could not tell her what caused it. She was recently hospitalized with a COPD exacerbation and treated with levofloxacin and prednisone. She states that she has not been on any other new medications recently.   Review of Systems: Constitutional: positive for chills, negative for anorexia, fevers, sweats and weight loss Eyes: negative Ears, nose, mouth, throat, and face: negative Respiratory: positive for chronic bronchitis and dyspnea on exertion, negative for pleurisy/chest pain and wheezing Cardiovascular: negative Gastrointestinal: positive for diarrhea, nausea and vomiting, negative for abdominal pain Genitourinary:negative Integument/breast: positive for pruritus and rash  Past Medical History  Diagnosis Date  . COPD (chronic obstructive pulmonary disease)   . Asthma   . Bipolar disorder, unspecified   . High blood pressure   . PTSD (post-traumatic stress disorder)   . Gastric ulcer   . CHF (congestive heart failure)   . Diabetes mellitus type II, controlled   . Paranoid schizophrenia   . TIA (transient ischemic attack)   . Shortness of breath dyspnea   . Sleep apnea   . GERD (gastroesophageal reflux disease)   .  Headache   . Nerve damage     History  Substance Use Topics  . Smoking status: Current Every Day Smoker -- 1.00 packs/day for 45 years    Types: Cigarettes  . Smokeless tobacco: Never Used  . Alcohol Use: No    Family History  Problem Relation Age of Onset  . Diabetes Maternal Grandmother   . Cancer Maternal Grandmother     breast  . Hyperlipidemia Father   . Hypertension Father   . Cancer Paternal Grandmother     breast   Allergies  Allergen Reactions  . Other Anaphylaxis and Other (See Comments)      Dioxide   . Penicillins Anaphylaxis  . Abilify [Aripiprazole] Other (See Comments)    Feels like a heart attack  . Hydrocodone Other (See Comments)    Pt flips out for 3 days    OBJECTIVE: Blood pressure 126/73, pulse 107, temperature 98 F (36.7 C), temperature source Oral, resp. rate 15, height 5\' 5"  (1.651 m), weight 242 lb 8.1 oz (110 kg), SpO2 91 %. General: she is alert but very uncomfortable due to to the pruritus Skin: diffuse patchy erythema with urticarial lesions with raised, circular red borders Eyes: Conjunctival redness bilaterally Oral: Dried mucosa but no oropharyngeal lesions noted Lungs: clear Cor: tachycardic but regular S1 and S2 with no murmurs Abdomen: soft and nontender with quiet bowel sounds  Lab Results Lab Results  Component Value Date   WBC 6.8 06/16/2014   HGB 17.0* 06/16/2014   HCT 49.8* 06/16/2014   MCV 85.3 06/16/2014   PLT 257 06/16/2014    Lab Results  Component Value Date   CREATININE 2.21* 06/16/2014   BUN 31* 06/16/2014   NA 135* 06/16/2014   K 4.9 06/16/2014   CL 96 06/16/2014   CO2 18* 06/16/2014    Lab Results  Component Value Date   ALT 21 06/16/2014   AST 34 06/16/2014   ALKPHOS 78 06/16/2014   BILITOT 0.8 06/16/2014     Microbiology: No results found for this or any previous visit (from the past 240 hour(s)).  Cliffton AstersJohn Kalese Ensz, MD [facility] [phone] pager   [phone] cell 06/16/2014, 3:55 PM

## 2014-06-16 NOTE — ED Provider Notes (Signed)
CSN: [id number]     Arrival date & time 06/16/14  1059 History   First MD Initiated Contact with Patient 06/16/14 1059     Chief Complaint  Patient presents with  . Urticaria  . Allergic Reaction     (Consider location/radiation/quality/duration/timing/severity/associated sxs/prior Treatment) HPI Comments: Patient is a 53 year old female with a past medical history of asthma, COPD, bipolar disorder, paranoid schizophrenia, diabetes, and hypertension who presents with a rash for the past 2 days. The rash started gradually after eating [employer] and progressively worsened since the onset. The rash is located on her generalized body excluding the mouth. Patient has tried benadryl and prednisone and bactrim that her PCP prescribed without relief. Patient has taken these medications previously without problems. Patient denies new exposures to medications, soaps, lotions, detergent. Patient reports associated itching, SOB and diarrhea. No aggravating/alleviating factors. Patient denies fever, chills, nausea, vomiting, sore throat, oral lesions, ocular involvement, throat closing, wheezing, chest pain, abdominal pain.      Past Medical History  Diagnosis Date  . COPD (chronic obstructive pulmonary disease)   . Asthma   . Bipolar disorder, unspecified   . High blood pressure   . PTSD (post-traumatic stress disorder)   . Gastric ulcer   . CHF (congestive heart failure)   . Diabetes mellitus type II, controlled   . Paranoid schizophrenia    Past Surgical History  Procedure Laterality Date  . Tonsillectomy  1968  . Cesarean section  1983  . Back surgery  2003    Dr.Broom, has lumbar spine implants [facility]-3D spinal system  . Cardiac catheterization  2013   Family History  Problem Relation Age of Onset  . Diabetes Maternal Grandmother   . Cancer Maternal Grandmother     breast  . Hyperlipidemia Father   . Hypertension Father   . Cancer Paternal Grandmother     breast   History   Substance Use Topics  . Smoking status: Current Every Day Smoker -- 1.00 packs/day for 45 years    Types: Cigarettes  . Smokeless tobacco: Not on file  . Alcohol Use: Not on file   OB History    No data available     Review of Systems  Constitutional: Negative for fever, chills and fatigue.  HENT: Negative for trouble swallowing.   Eyes: Negative for visual disturbance.  Respiratory: Positive for shortness of breath.   Cardiovascular: Negative for chest pain and palpitations.  Gastrointestinal: Positive for diarrhea. Negative for nausea, vomiting and abdominal pain.  Genitourinary: Negative for dysuria and difficulty urinating.  Musculoskeletal: Negative for arthralgias and neck pain.  Skin: Positive for rash. Negative for color change.  Neurological: Negative for dizziness and weakness.  Psychiatric/Behavioral: Negative for dysphoric mood.      Allergies  Other; Penicillins; Abilify; and Hydrocodone  Home Medications   Prior to Admission medications   Medication Sig Start Date End Date Taking? Authorizing Provider  albuterol (PROVENTIL HFA;VENTOLIN HFA) 108 (90 BASE) MCG/ACT inhaler Inhale 1 puff into the lungs every 6 (six) hours as needed for wheezing or shortness of breath.   Yes Historical Provider, MD  albuterol (PROVENTIL) (2.5 MG/3ML) 0.083% nebulizer solution Take 2.5 mg by nebulization every 6 (six) hours as needed for wheezing or shortness of breath.   Yes Historical Provider, MD  aspirin EC 81 MG tablet Take 81 mg by mouth daily.   Yes Historical Provider, MD  baclofen (LIORESAL) 10 MG tablet Take 1 tablet (10 mg total) by mouth 3 (three)  times daily as needed for muscle spasms. 01/10/14  Yes [name] L [name], DO  butalbital-acetaminophen-caffeine (FIORICET, ESGIC) 50-325-40 MG per tablet Take 1 tablet by mouth 2 (two) times daily as needed for headache.   Yes Historical Provider, MD  carvedilol (COREG) 6.25 MG tablet Take 6.25 mg by mouth 2 (two) times daily.   Yes  Historical Provider, MD  Dextromethorphan-Guaifenesin (ROBITUSSIN DM PO) Take 10 mLs by mouth 3 (three) times daily.   Yes Historical Provider, MD  diphenhydrAMINE (SOMINEX) 25 MG tablet Take 25 mg by mouth at bedtime as needed for itching, allergies or sleep.   Yes Historical Provider, MD  FLUoxetine (PROZAC) 20 MG capsule Take 20 mg by mouth daily.   Yes Historical Provider, MD  furosemide (LASIX) 20 MG tablet Take 1 tablet (20 mg total) by mouth daily. 01/10/14  Yes [name] L [name], DO  gabapentin (NEURONTIN) 400 MG capsule Take 400 mg by mouth 3 (three) times daily.   Yes Historical Provider, MD  ipratropium-albuterol (DUONEB) 0.5-2.5 (3) MG/3ML SOLN Take 3 mLs by nebulization every 4 (four) hours as needed (wheezing). 05/24/14  Yes Penny Pia, MD  loratadine (ALLERGY) 10 MG tablet Take 10 mg by mouth daily as needed for allergies.    Yes Historical Provider, MD  LORazepam (ATIVAN) 1 MG tablet Take 1 mg by mouth 3 (three) times daily.   Yes Historical Provider, MD  methylPREDNIsolone (MEDROL DOSPACK) 4 MG tablet Take 4 mg by mouth as directed. For 6 days 06/15/14  Yes Historical Provider, MD  naproxen (NAPROSYN) 500 MG tablet Take 500 mg by mouth 2 (two) times daily.   Yes Historical Provider, MD  nitroGLYCERIN (NITROLINGUAL) 0.4 MG/SPRAY spray Place 1 spray under the tongue every 5 (five) minutes x 3 doses as needed for chest pain.   Yes Historical Provider, MD  omeprazole (PRILOSEC) 20 MG capsule Take 20 mg by mouth daily.   Yes Historical Provider, MD  ondansetron (ZOFRAN) 4 MG tablet Take 1 tablet (4 mg total) by mouth once. 05/24/14  Yes Penny Pia, MD  oxyCODONE-acetaminophen (PERCOCET/ROXICET) 5-325 MG per tablet Take 1-2 tablets by mouth every 6 (six) hours as needed for severe pain. 05/24/14  Yes Penny Pia, MD  pravastatin (PRAVACHOL) 20 MG tablet Take 1 tablet (20 mg total) by mouth daily. 01/10/14  Yes [name] L [name], DO  promethazine (PHENERGAN) 25 MG tablet Take 25 mg by mouth every  6 (six) hours as needed for nausea or vomiting.   Yes Historical Provider, MD  ranitidine (ACID REDUCER) 150 MG tablet Take 150 mg by mouth as needed for heartburn.   Yes Historical Provider, MD  sulfamethoxazole-trimethoprim (BACTRIM DS,SEPTRA DS) 800-160 MG per tablet Take 1 tablet by mouth 2 (two) times daily. For 10 days 06/15/14  Yes Historical Provider, MD   BP 123/64 mmHg  Pulse 66  Temp(Src) 97.7 F (36.5 C) (Oral)  Resp 20  Ht 5\' 5"  (1.651 m)  Wt 236 lb (107.049 kg)  BMI 39.27 kg/m2  SpO2 96% Physical Exam  Constitutional: She is oriented to person, place, and time. She appears well-developed and well-nourished. No distress.  HENT:  Head: Normocephalic and atraumatic.  Eyes: Conjunctivae and EOM are normal.  Neck: Normal range of motion.  Cardiovascular: Regular rhythm.  Exam reveals no gallop and no friction rub.   No murmur heard. tachycardic  Pulmonary/Chest: Effort normal and breath sounds normal. She has no wheezes. She has no rales. She exhibits no tenderness.  Abdominal: Soft. She exhibits no distension.  There is no tenderness. There is no rebound.  Musculoskeletal: Normal range of motion.  Neurological: She is alert and oriented to person, place, and time.  Speech is goal-oriented. Moves limbs without ataxia.   Skin: Skin is warm and dry.  Generalized, slightly raised target lesions that are intensely pruritic with overlying excoriations on abdomen, chest, back, legs, arms, and scalp. No mouth lesions.   Psychiatric: She has a normal mood and affect. Her behavior is normal.  Nursing note and vitals reviewed.   ED Course  Procedures (including critical care time) Labs Review Labs Reviewed  CBC WITH DIFFERENTIAL - Abnormal; Notable for the following:    RBC 6.47 (*)    Hemoglobin 19.5 (*)    HCT 55.7 (*)    All other components within normal limits  COMPREHENSIVE METABOLIC PANEL - Abnormal; Notable for the following:    Sodium 135 (*)    CO2 18 (*)     Glucose, Bld 147 (*)    BUN 31 (*)    Creatinine, Ser 2.17 (*)    GFR calc non Af Amer 24 (*)    GFR calc Af Amer 28 (*)    Anion gap 21 (*)    All other components within normal limits  CLOSTRIDIUM DIFFICILE BY PCR  SEDIMENTATION RATE  C-REACTIVE PROTEIN  GI PATHOGEN PANEL BY PCR, STOOL    Imaging Review Dg Chest 2 View  06/16/2014   CLINICAL DATA:  Diffuse rash, initial evaluation  EXAM: CHEST  2 VIEW  COMPARISON:  05/23/2014  FINDINGS: The heart size and vascular pattern are normal. Mild diffuse interstitial change is stable. No consolidation effusion or pneumothorax.  IMPRESSION: Stable mild chronic interstitial lung change.  No acute findings   Electronically Signed   By: Esperanza Heiraymond  Rubner M.D.   On: 06/16/2014 12:23     EKG Interpretation None      MDM   Final diagnoses:  SOB (shortness of breath)  Hypoxia  AKI (acute kidney injury)  Rash    11:24 AM Labs and chest xray pending. Patient's oxygen saturation in low 90's% on 2L nasal cannula. Patient tachycardic in 120's. Patient will have fluids, solumedrol, benadryl, pepcid, and albuterol nebulizer.   Patient will be admitted for AKI and oxygen dependence.   Emilia BeckKaitlyn Rani Idler, PA-C 06/16/14 1415  Mirian MoMatthew Gentry, MD 06/16/14 1556

## 2014-06-16 NOTE — Progress Notes (Signed)
MD notified due to acute change in patient status. Vital signs and labs are listed below.  MD notified(1st page) Time of 1st page:  Isidoro Donningai Responding MD:  Isidoro Donningai Time MD responded: 2:52 PM  MD response: MD stated aware  Vital Signs Filed Vitals:   06/16/14 1215 06/16/14 1311 06/16/14 1345 06/16/14 1428  BP: 103/64 136/62 125/72 126/73  Pulse: 116 114 118 107  Temp:    98 F (36.7 C)  TempSrc:    Oral  Resp:  18 24 15   Height:    5\' 5"  (1.651 m)  Weight:    110 kg (242 lb 8.1 oz)  SpO2: 94% 94%  91%     Lab Results WBC  Date/Time Value Ref Range Status  06/16/2014 11:17 AM 8.4 4.0 - 10.5 K/uL Final  05/24/2014 03:45 AM 8.9 4.0 - 10.5 K/uL Final  05/23/2014 05:18 PM 9.8 4.0 - 10.5 K/uL Final  01/10/2014 10:42 AM 10.1 3.4 - 10.8 x10E3/uL Final   NEUTROPHILS RELATIVE %  Date/Time Value Ref Range Status  06/16/2014 11:17 AM 72 43 - 77 % Final  05/23/2014 05:18 PM 57 43 - 77 % Final  01/10/2014 10:42 AM 63 % Final   No results found for: PCO2ART No results found for: LATICACIDVEN No results found for: PCO2VEN   Else Habermann L, RN 06/16/2014, 2:52 PM

## 2014-06-16 NOTE — ED Notes (Signed)
Pt sts she is more itchy than when she first got here.  Pt on phone when relaying this to RN with no signs of scratching to skin.  PA made aware.

## 2014-06-16 NOTE — Progress Notes (Signed)
Patient wishes to be DNR at this time. MD Rai notified via text page.

## 2014-06-16 NOTE — Progress Notes (Addendum)
Patient reported chest tightness and tingling of right arm. Patient states "it felt like this last time and they gave me Nitro and it works"   BP 129/81 Pulse 97. MD Rai notified via text page. Telephone order to give sublingual nitro now and stat troponins, stat 12lead EKG and Gi cocktail ordered. Neuro assessment performed and is within normal limits. Will continue to monitor.   6:27pm - Pt denies any chest discomfort at this time

## 2014-06-16 NOTE — Progress Notes (Signed)
[facility] to be [facility] into 5w15

## 2014-06-16 NOTE — ED Notes (Signed)
Pt reporting nausea after treatment.  Dr. Littie DeedsGentry made aware.  Zofran IV to be ordered.

## 2014-06-16 NOTE — H&P (Signed)
History and Physical       Hospital Admission Note Date: 06/16/2014  Patient name: Melissa Stanton Medical record number: [id number] Date of birth: [birth year] Age: 53 y.o. Gender: female  PCP: [name], [name], DO    Chief Complaint:  Diffuse rash with intractable nausea, vomiting and diarrhea for last 3 days  HPI: Patient is a 53 year old female with asthma, COPD, bipolar disorder, diabetes, hypertension presented to ER with diffuse rash and above symptoms for last 3 days. Patient reported that she ate at [name] 3 days ago on Tuesday. Patient reports that she had a [place] pizza with cheese, beans and tomato toppings, she only had one slice when she felt something "off". She did not finish the pizza got home, after half an hour noticed rash on her back. She tried over-the-counter Benadryl without relief. Patient went to her PCP yesterday and was given prednisone pack, Phenergan and Bactrim. Patient felt her symptoms were worsening and presented to the ED, she was noticed to be hypoxic with pulse ox of 87% on room air. She also reported intractable nausea, vomiting and diarrhea since the [employer] meal. She denies any abdominal pain, fevers but admits to having chills, no hematemesis, hematochezia or melena. No joint pains or swelling. In ED, patient was noted to have sodium of 135, potassium 4.9, BUN 31 metabolic acidosis with CO2 18, creatinine 2.17, BUN 31. Hemoglobin 19.5, hematocrit 55.7.  Review of Systems:  Constitutional: Denies fever, +chills, diaphoresis, poor appetite and fatigue.  HEENT: Denies photophobia, eye pain, redness, hearing loss, ear pain, congestion, sore throat, rhinorrhea, sneezing, mouth sores, trouble swallowing, neck pain, neck stiffness and tinnitus.   Respiratory: Patient admitted to having wheezing today, received breathing treatment in ED.   Cardiovascular: Denies chest pain, palpitations and leg swelling.   Gastrointestinal: Please see history of present illness  Genitourinary: Denies dysuria, urgency, frequency, hematuria, flank pain and difficulty urinating.  Musculoskeletal: Denies myalgias, back pain, joint swelling, arthralgias and gait problem.  Skin:Please see history of present illness  Neurological: Denies dizziness, seizures, syncope, weakness, light-headedness, numbness and headaches.  Hematological: Denies adenopathy. Easy bruising, personal or family bleeding history  Psychiatric/Behavioral: Denies suicidal ideation, mood changes, confusion, nervousness, sleep disturbance and agitation  Past Medical History: Past Medical History  Diagnosis Date  . COPD (chronic obstructive pulmonary disease)   . Asthma   . Bipolar disorder, unspecified   . High blood pressure   . PTSD (post-traumatic stress disorder)   . Gastric ulcer   . CHF (congestive heart failure)   . Diabetes mellitus type II, controlled   . Paranoid schizophrenia    Past Surgical History  Procedure Laterality Date  . Tonsillectomy  1968  . Cesarean section  1983  . Back surgery  2003    Dr.Broom, has lumbar spine implants [facility]-3D spinal system  . Cardiac catheterization  2013    Medications: Prior to Admission medications   Medication Sig Start Date End Date Taking? Authorizing Provider  albuterol (PROVENTIL HFA;VENTOLIN HFA) 108 (90 BASE) MCG/ACT inhaler Inhale 1 puff into the lungs every 6 (six) hours as needed for wheezing or shortness of breath.   Yes Historical Provider, MD  albuterol (PROVENTIL) (2.5 MG/3ML) 0.083% nebulizer solution Take 2.5 mg by nebulization every 6 (six) hours as needed for wheezing or shortness of breath.   Yes Historical Provider, MD  aspirin EC 81 MG tablet Take 81 mg by mouth daily.   Yes Historical Provider, MD  baclofen (LIORESAL) 10 MG tablet Take 1  tablet (10 mg total) by mouth 3 (three) times daily as needed for muscle spasms. 01/10/14  Yes [name] L [name], DO   butalbital-acetaminophen-caffeine (FIORICET, ESGIC) 50-325-40 MG per tablet Take 1 tablet by mouth 2 (two) times daily as needed for headache.   Yes Historical Provider, MD  carvedilol (COREG) 6.25 MG tablet Take 6.25 mg by mouth 2 (two) times daily.   Yes Historical Provider, MD  Dextromethorphan-Guaifenesin (ROBITUSSIN DM PO) Take 10 mLs by mouth 3 (three) times daily.   Yes Historical Provider, MD  diphenhydrAMINE (SOMINEX) 25 MG tablet Take 25 mg by mouth at bedtime as needed for itching, allergies or sleep.   Yes Historical Provider, MD  FLUoxetine (PROZAC) 20 MG capsule Take 20 mg by mouth daily.   Yes Historical Provider, MD  furosemide (LASIX) 20 MG tablet Take 1 tablet (20 mg total) by mouth daily. 01/10/14  Yes [name] L [name], DO  gabapentin (NEURONTIN) 400 MG capsule Take 400 mg by mouth 3 (three) times daily.   Yes Historical Provider, MD  ipratropium-albuterol (DUONEB) 0.5-2.5 (3) MG/3ML SOLN Take 3 mLs by nebulization every 4 (four) hours as needed (wheezing). 05/24/14  Yes Penny Piarlando Vega, MD  loratadine (ALLERGY) 10 MG tablet Take 10 mg by mouth daily as needed for allergies.    Yes Historical Provider, MD  LORazepam (ATIVAN) 1 MG tablet Take 1 mg by mouth 3 (three) times daily.   Yes Historical Provider, MD  methylPREDNIsolone (MEDROL DOSPACK) 4 MG tablet Take 4 mg by mouth as directed. For 6 days 06/15/14  Yes Historical Provider, MD  naproxen (NAPROSYN) 500 MG tablet Take 500 mg by mouth 2 (two) times daily.   Yes Historical Provider, MD  nitroGLYCERIN (NITROLINGUAL) 0.4 MG/SPRAY spray Place 1 spray under the tongue every 5 (five) minutes x 3 doses as needed for chest pain.   Yes Historical Provider, MD  omeprazole (PRILOSEC) 20 MG capsule Take 20 mg by mouth daily.   Yes Historical Provider, MD  ondansetron (ZOFRAN) 4 MG tablet Take 1 tablet (4 mg total) by mouth once. 05/24/14  Yes Penny Piarlando Vega, MD  oxyCODONE-acetaminophen (PERCOCET/ROXICET) 5-325 MG per tablet Take 1-2 tablets by  mouth every 6 (six) hours as needed for severe pain. 05/24/14  Yes Penny Piarlando Vega, MD  pravastatin (PRAVACHOL) 20 MG tablet Take 1 tablet (20 mg total) by mouth daily. 01/10/14  Yes [name] L [name], DO  promethazine (PHENERGAN) 25 MG tablet Take 25 mg by mouth every 6 (six) hours as needed for nausea or vomiting.   Yes Historical Provider, MD  ranitidine (ACID REDUCER) 150 MG tablet Take 150 mg by mouth as needed for heartburn.   Yes Historical Provider, MD  sulfamethoxazole-trimethoprim (BACTRIM DS,SEPTRA DS) 800-160 MG per tablet Take 1 tablet by mouth 2 (two) times daily. For 10 days 06/15/14  Yes Historical Provider, MD    Allergies:   Allergies  Allergen Reactions  . Other Anaphylaxis and Other (See Comments)    Dioxide   . Penicillins Anaphylaxis  . Abilify [Aripiprazole] Other (See Comments)    Feels like a heart attack  . Hydrocodone Other (See Comments)    Pt flips out for 3 days    Social History:  reports that she has been smoking Cigarettes.  She has a 45 pack-year smoking history. She does not have any smokeless tobacco history on file. Her alcohol and drug histories are not on file.  Family History: Family History  Problem Relation Age of Onset  . Diabetes Maternal Grandmother   .  Cancer Maternal Grandmother     breast  . Hyperlipidemia Father   . Hypertension Father   . Cancer Paternal Grandmother     breast    Physical Exam: Blood pressure 136/62, pulse 114, temperature 97.7 F (36.5 C), temperature source Oral, resp. rate 18, height 5\' 5"  (1.651 m), weight 107.049 kg (236 lb), SpO2 94 %. General: Alert, awake, oriented x3, in no acute distress, Uncomfortable. HEENT: normocephalic, atraumatic, anicteric sclera, pink conjunctiva, pupils equal and reactive to light and accomodation, oropharynx clear Neck: supple, no masses or lymphadenopathy, no goiter, no bruits  Heart: Regular rate and rhythm, without murmurs, rubs or gallops. Lungs:  mild scattered wheezing  bilaterally  Abdomen: Soft,obese  nontender, nondistended, positive bowel sounds, no masses. Extremities: No clubbing, cyanosis or edema with positive pedal pulses. Neuro: Grossly intact, no focal neurological deficits, strength 5/5 upper and lower extremities bilaterally Psych: alert and oriented x 3, normal mood and affect Skin: Diffuse generalized erythmatous maculopapular rash, some target lesions, pruritic, on abdomen, chest, thighs, legs and arms, no mouth lesions  LABS on Admission:  Basic Metabolic Panel:  Recent Labs Lab 06/16/14 1117  NA 135*  K 4.9  CL 96  CO2 18*  GLUCOSE 147*  BUN 31*  CREATININE 2.17*  CALCIUM 9.6   Liver Function Tests:  Recent Labs Lab 06/16/14 1117  AST 34  ALT 21  ALKPHOS 78  BILITOT 0.8  PROT 7.4  ALBUMIN 3.6   No results for input(s): LIPASE, AMYLASE in the last 168 hours. No results for input(s): AMMONIA in the last 168 hours. CBC:  Recent Labs Lab 06/16/14 1117  WBC 8.4  NEUTROABS 6.0  HGB 19.5*  HCT 55.7*  MCV 86.1  PLT 285   Cardiac Enzymes: No results for input(s): CKTOTAL, CKMB, CKMBINDEX, TROPONINI in the last 168 hours. BNP: Invalid input(s): POCBNP CBG: No results for input(s): GLUCAP in the last 168 hours.   Radiological Exams on Admission: Dg Chest 2 View  06/16/2014   CLINICAL DATA:  Diffuse rash, initial evaluation  EXAM: CHEST  2 VIEW  COMPARISON:  05/23/2014  FINDINGS: The heart size and vascular pattern are normal. Mild diffuse interstitial change is stable. No consolidation effusion or pneumothorax.  IMPRESSION: Stable mild chronic interstitial lung change.  No acute findings   Electronically Signed   By: Esperanza Heir M.D.   On: 06/16/2014 12:23   Dg Chest 2 View  05/23/2014   CLINICAL DATA:  53 year old female with cough and shortness of Breath with intermittent chest pain for 1 month. Initial encounter.  EXAM: CHEST  2 VIEW  COMPARISON:  None.  FINDINGS: Mildly low lung volumes. Normal cardiac  size and mediastinal contours. Visualized tracheal air column is within normal limits. Increased interstitial markings diffusely. No pneumothorax or pleural effusion. No consolidation or confluent pulmonary opacity. Degenerative endplate changes in the spine. No acute osseous abnormality identified.  IMPRESSION: Chronic pulmonary interstitial changes suspected. No superimposed acute findings are identified.   Electronically Signed   By: Augusto Gamble M.D.   On: 05/23/2014 17:14    Assessment/Plan Principal Problem:   AKI (acute kidney injury) likely due to intractable nausea, vomiting, diarrhea and patient started on Bactrim yesterday, also takes NSAIDs, Lasix outpatient  -  hold and naproxen, Lasix, Bactrim, start on IV fluid hydration  - If no improvement after IV fluids, consider renal ultrasound   Active Problems: Diffuse maculopapular rash- Likely due to allergic reaction/urticaria from food poisoning , started after eating at [facility]  [employer] - continue IV steroids, Benadryl, Pepcid, Claritin - Called infectious disease consult for any further recommendations  Intractable nausea, vomiting, diarrhea: Likely infectious gastroenteritis, symptoms coinciding after eating at [employer] - place on aggressive IV fluid hydration, scheduled Zofran for 24 hours, - Clear liquid diet, advance as tolerated, antiemetics    Essential hypertension, benign - For now continue with Coreg    Severe obesity (BMI >= 40) - Patient counseled on diet and weight control    Diabetes mellitus type II, controlled - Obtain hemoglobin A1c, place on sliding scale insulin     mild asthma exacerbation -  place on DuoNeb's, currently on steroids for the diffuse rash  DVT prophylaxis:  heparin subcutaneous   CODE STATUS:  full code   Family Communication: Admission, patients condition and plan of care including tests being ordered have been discussed with the patient  who indicates understanding and agree with the  plan and Code Status   Further plan will depend as patient's clinical course evolves and further radiologic and laboratory data become available.   Time Spent on Admission: 1 hour  Elias Dennington M.D. [facility] 06/16/2014, 1:36 PM Pager: [phone]  If 7PM-7AM, please contact night-coverage [email or web address] Password [email or web address]

## 2014-06-17 LAB — BASIC METABOLIC PANEL
ANION GAP: 18 — AB (ref 5–15)
BUN: 47 mg/dL — ABNORMAL HIGH (ref 6–23)
CHLORIDE: 98 meq/L (ref 96–112)
CO2: 17 meq/L — AB (ref 19–32)
Calcium: 9.1 mg/dL (ref 8.4–10.5)
Creatinine, Ser: 2.08 mg/dL — ABNORMAL HIGH (ref 0.50–1.10)
GFR calc Af Amer: 30 mL/min — ABNORMAL LOW (ref >90)
GFR calc non Af Amer: 26 mL/min — ABNORMAL LOW (ref >90)
GLUCOSE: 133 mg/dL — AB (ref 70–99)
Potassium: 5.7 mEq/L — ABNORMAL HIGH (ref 3.7–5.3)
Sodium: 133 mEq/L — ABNORMAL LOW (ref 137–147)

## 2014-06-17 LAB — CBC
HEMATOCRIT: 47.6 % — AB (ref 36.0–46.0)
Hemoglobin: 16.2 g/dL — ABNORMAL HIGH (ref 12.0–15.0)
MCH: 28.5 pg (ref 26.0–34.0)
MCHC: 34 g/dL (ref 30.0–36.0)
MCV: 83.8 fL (ref 78.0–100.0)
Platelets: 256 10*3/uL (ref 150–400)
RBC: 5.68 MIL/uL — ABNORMAL HIGH (ref 3.87–5.11)
RDW: 15.3 % (ref 11.5–15.5)
WBC: 6.3 10*3/uL (ref 4.0–10.5)

## 2014-06-17 LAB — GLUCOSE, CAPILLARY
GLUCOSE-CAPILLARY: 147 mg/dL — AB (ref 70–99)
Glucose-Capillary: 121 mg/dL — ABNORMAL HIGH (ref 70–99)
Glucose-Capillary: 125 mg/dL — ABNORMAL HIGH (ref 70–99)
Glucose-Capillary: 122 mg/dL — ABNORMAL HIGH (ref 70–99)

## 2014-06-17 LAB — HEMOGLOBIN A1C
Hgb A1c MFr Bld: 5.8 % — ABNORMAL HIGH (ref <5.7)
MEAN PLASMA GLUCOSE: 120 mg/dL — AB (ref <117)

## 2014-06-17 LAB — URINE CULTURE: Colony Count: >=100000

## 2014-06-17 LAB — POTASSIUM: Potassium: 4.8 mEq/L (ref 3.7–5.3)

## 2014-06-17 MED ORDER — BACLOFEN 5 MG HALF TABLET
5.0000 mg | ORAL_TABLET | Freq: Three times a day (TID) | ORAL | Status: DC | PRN
  Filled 2014-06-17: qty 1

## 2014-06-17 MED ORDER — SODIUM CHLORIDE 0.9 % IV SOLN
INTRAVENOUS | Status: AC
  Administered 2014-06-17: 13:00:00 via INTRAVENOUS

## 2014-06-17 MED ORDER — DIPHENHYDRAMINE HCL 50 MG/ML IJ SOLN
50.0000 mg | Freq: Once | INTRAMUSCULAR | Status: AC
  Administered 2014-06-17: 50 mg via INTRAVENOUS
  Filled 2014-06-17: qty 1

## 2014-06-17 MED ORDER — DIPHENHYDRAMINE HCL 25 MG PO CAPS
25.0000 mg | ORAL_CAPSULE | Freq: Two times a day (BID) | ORAL | Status: DC
  Administered 2014-06-17 (×2): 25 mg via ORAL
  Filled 2014-06-17 (×4): qty 1

## 2014-06-17 MED ORDER — METHYLPREDNISOLONE SODIUM SUCC 125 MG IJ SOLR
60.0000 mg | Freq: Once | INTRAMUSCULAR | Status: AC
  Administered 2014-06-17: 60 mg via INTRAVENOUS

## 2014-06-17 MED ORDER — ONDANSETRON HCL 4 MG/2ML IJ SOLN: 4.0000 mg | Freq: Four times a day (QID) | INTRAMUSCULAR | Status: DC | PRN

## 2014-06-17 MED ORDER — FAMOTIDINE IN NACL 20-0.9 MG/50ML-% IV SOLN
20.0000 mg | Freq: Once | INTRAVENOUS | Status: AC
  Administered 2014-06-17: 20 mg via INTRAVENOUS
  Filled 2014-06-17: qty 50

## 2014-06-17 MED ORDER — FAMOTIDINE 40 MG PO TABS
40.0000 mg | ORAL_TABLET | Freq: Every day | ORAL | Status: DC
  Administered 2014-06-17 – 2014-06-19 (×3): 40 mg via ORAL
  Filled 2014-06-17 (×3): qty 1

## 2014-06-17 NOTE — Progress Notes (Addendum)
Patient ID: Melissa Stanton, female   DOB: [birth year], 53 y.o.   MRN: [id number]         [facility]    Date of Admission:  06/16/2014     Active Problems:   Urticaria   Diarrhea   Nausea & vomiting   Acute kidney injury   Essential hypertension, benign   Severe obesity (BMI >= 40)   Diabetes mellitus type II, controlled   COPD exacerbation   . antiseptic oral rinse  7 mL Mouth Rinse BID  . aspirin EC  81 mg Oral Daily  . carvedilol  6.25 mg Oral BID  . diphenhydrAMINE  25 mg Oral BID  . famotidine  40 mg Oral Daily  . FLUoxetine  20 mg Oral Daily  . gabapentin  400 mg Oral TID  . heparin  5,000 Units Subcutaneous 3 times per day  . insulin aspart  0-5 Units Subcutaneous QHS  . insulin aspart  0-9 Units Subcutaneous TID WC  . ipratropium-albuterol  3 mL Nebulization QID  . loratadine  10 mg Oral Daily  . LORazepam  1 mg Oral TID  . methylPREDNISolone (SOLU-MEDROL) injection  60 mg Intravenous Q12H  . sodium chloride  3 mL Intravenous Q12H    Subjective: The diphenhydramine has made her very sleepy but overall she is feeling much better. Her severe itching has improved. She has not had any diarrhea since admission and now feels constipated.  Review of Systems: Pertinent items are noted in HPI.  Past Medical History  Diagnosis Date  . COPD (chronic obstructive pulmonary disease)   . Asthma   . Bipolar disorder, unspecified   . High blood pressure   . PTSD (post-traumatic stress disorder)   . Gastric ulcer   . CHF (congestive heart failure)   . Diabetes mellitus type II, controlled   . Paranoid schizophrenia   . TIA (transient ischemic attack)   . Shortness of breath dyspnea   . Sleep apnea   . GERD (gastroesophageal reflux disease)   . Headache   . Nerve damage     History  Substance Use Topics  . Smoking status: Current Every Day Smoker -- 1.00 packs/day for 45 years    Types: Cigarettes  . Smokeless tobacco: Never Used  .  Alcohol Use: No    Family History  Problem Relation Age of Onset  . Diabetes Maternal Grandmother   . Cancer Maternal Grandmother     breast  . Hyperlipidemia Father   . Hypertension Father   . Cancer Paternal Grandmother     breast   Allergies  Allergen Reactions  . Other Anaphylaxis and Other (See Comments)    Dioxide   . Penicillins Anaphylaxis  . Abilify [Aripiprazole] Other (See Comments)    Feels like a heart attack  . Hydrocodone Other (See Comments)    Pt flips out for 3 days    OBJECTIVE: Blood pressure 154/81, pulse 99, temperature 98.2 F (36.8 C), temperature source Oral, resp. rate 15, height 5\' 5"  (1.651 m), weight 242 lb 8.1 oz (110 kg), SpO2 99 %. General: she was sleeping soundly when I entered the room and awakened slowly Skin: urticaria improving. Facial edema noted Lungs: clear Cor: regular S1 and S2 with no murmurs Abdomen: obese, soft and nontender  Lab Results Lab Results  Component Value Date   WBC 6.3 06/17/2014   HGB 16.2* 06/17/2014   HCT 47.6* 06/17/2014   MCV 83.8 06/17/2014   PLT 256 06/17/2014  Lab Results  Component Value Date   CREATININE 2.08* 06/17/2014   BUN 47* 06/17/2014   NA 133* 06/17/2014   K 4.8 06/17/2014   CL 98 06/17/2014   CO2 17* 06/17/2014    Lab Results  Component Value Date   ALT 21 06/16/2014   AST 34 06/16/2014   ALKPHOS 78 06/16/2014   BILITOT 0.8 06/16/2014     Microbiology: No results found for this or any previous visit (from the past 240 hour(s)).  Assessment: She has urticaria of unknown etiology that is improving. There is no evidence of infection.  Plan: 1. Continue treatment for urticaria 2. Discontinue enteric precautions 3. I will sign off now  Cliffton AstersJohn Ashlynd Michna, MD [facility] [phone] pager   [phone] 06/17/2014, 5:03 PM

## 2014-06-17 NOTE — Progress Notes (Signed)
Patient Demographics  Melissa Stanton, is a 53 y.o. female, DOB - 1960/08/06, WUJ:[id number]  Admit date - 06/16/2014   Admitting Physician Ripudeep Jenna LuoK Rai, MD  Outpatient Primary MD for the patient is [name], [name], DO  LOS - 1   Chief Complaint  Patient presents with  . Urticaria  . Allergic Reaction        Subjective:   Melissa Stanton today has, No headache, No chest pain, No abdominal pain - No Nausea, No new weakness tingling or numbness, No Cough - SOB.  Improving rash  Assessment & Plan    1.Utricaria - with nausea vomiting and diarrhea. After eating at [name] a few days ago, much improved with supportive care which includes IV steroids and IV H1 and H2-blockers. IV fluids. Antinausea medication. No throat itching or tongue swelling. Rash improving.   2. Gastroenteritis. Rule out C. difficile, no further diarrhea. Antinausea medication for any nausea which is much improved now.   3.DM2 - on sliding scale.  CBG (last 3)   Recent Labs  06/16/14 1645 06/16/14 2134 06/17/14 0822  GLUCAP 163* 138* 147*    Lab Results  Component Value Date   HGBA1C 5.8* 06/16/2014        4. Essential hypertension, benign - on Coreg continue.     5. ARF. Due to dehydration from nausea vomiting and diarrhea. Improving with IV fluids. Continue. Avoid nephrotoxins. Peak BMP in the morning.    6. Morbid obesity. Outpatient follow-up with PCP      Code Status: Full  Family Communication: none present  Disposition Plan: Home   Procedures     Consults ID   Medications  Scheduled Meds: . antiseptic oral rinse  7 mL Mouth Rinse BID  . aspirin EC  81 mg Oral Daily  . carvedilol  6.25 mg Oral BID  . famotidine (PEPCID) IV  20 mg Intravenous Q12H  . FLUoxetine  20 mg  Oral Daily  . gabapentin  400 mg Oral TID  . heparin  5,000 Units Subcutaneous 3 times per day  . insulin aspart  0-5 Units Subcutaneous QHS  . insulin aspart  0-9 Units Subcutaneous TID WC  . ipratropium-albuterol  3 mL Nebulization QID  . loratadine  10 mg Oral Daily  . LORazepam  1 mg Oral TID  . methylPREDNISolone (SOLU-MEDROL) injection  60 mg Intravenous Q12H  . sodium chloride  3 mL Intravenous Q12H   Continuous Infusions: . sodium chloride     PRN Meds:.acetaminophen **OR** [DISCONTINUED] acetaminophen, alum & mag hydroxide-simeth, baclofen, diphenhydrAMINE, guaiFENesin-dextromethorphan, HYDROmorphone (DILAUDID) injection, nitroGLYCERIN, oxyCODONE-acetaminophen, promethazine  DVT Prophylaxis   Heparin    Lab Results  Component Value Date   PLT 256 06/17/2014    Antibiotics     Anti-infectives    Start     Dose/Rate Route Frequency Ordered Stop   06/16/14 1600  metroNIDAZOLE (FLAGYL) IVPB 500 mg  Status:  Discontinued     500 mg100 mL/hr over 60 Minutes Intravenous Every 8 hours 06/16/14 1443 06/16/14 1554          Objective:   Filed Vitals:   06/16/14 2137 06/17/14 0501 06/17/14 0727 06/17/14 0938  BP: 128/73 123/80  154/81  Pulse:  110  99  Temp: 99 F (37.2 C) 98.2 F (36.8 C)    TempSrc: Oral Oral    Resp: 15 15    Height:      Weight:      SpO2: 90% 91% 100%     Wt Readings from Last 3 Encounters:  06/16/14 110 kg (242 lb 8.1 oz)  05/24/14 107.3 kg (236 lb 8.9 oz)  01/10/14 110.315 kg (243 lb 3.2 oz)     Intake/Output Summary (Last 24 hours) at 06/17/14 1120 Last data filed at 06/17/14 0515  Gross per 24 hour  Intake      0 ml  Output    100 ml  Net   -100 ml     Physical Exam  Awake Alert, Oriented X 3, No new F.N deficits, Normal affect [place].AT,PERRAL Supple Neck,No JVD, No cervical lymphadenopathy appriciated.  Symmetrical Chest wall movement, Good air movement bilaterally, CTAB RRR,No Gallops,Rubs or new Murmurs, No Parasternal  Heave +ve B.Sounds, Abd Soft, No tenderness, No organomegaly appriciated, No rebound - guarding or rigidity. No Cyanosis, Clubbing or edema, improving diffuse utricarial rash   Data Review   Micro Results No results found for this or any previous visit (from the past 240 hour(s)).  Radiology Reports Dg Chest 2 View  06/16/2014   CLINICAL DATA:  Diffuse rash, initial evaluation  EXAM: CHEST  2 VIEW  COMPARISON:  05/23/2014  FINDINGS: The heart size and vascular pattern are normal. Mild diffuse interstitial change is stable. No consolidation effusion or pneumothorax.  IMPRESSION: Stable mild chronic interstitial lung change.  No acute findings   Electronically Signed   By: Esperanza Heir M.D.   On: 06/16/2014 12:23   Dg Chest 2 View  05/23/2014   CLINICAL DATA:  53 year old female with cough and shortness of Breath with intermittent chest pain for 1 month. Initial encounter.  EXAM: CHEST  2 VIEW  COMPARISON:  None.  FINDINGS: Mildly low lung volumes. Normal cardiac size and mediastinal contours. Visualized tracheal air column is within normal limits. Increased interstitial markings diffusely. No pneumothorax or pleural effusion. No consolidation or confluent pulmonary opacity. Degenerative endplate changes in the spine. No acute osseous abnormality identified.  IMPRESSION: Chronic pulmonary interstitial changes suspected. No superimposed acute findings are identified.   Electronically Signed   By: Augusto Gamble M.D.   On: 05/23/2014 17:14     CBC  Recent Labs Lab 06/16/14 1117 06/16/14 1445 06/17/14 0409  WBC 8.4 6.8 6.3  HGB 19.5* 17.0* 16.2*  HCT 55.7* 49.8* 47.6*  PLT 285 257 256  MCV 86.1 85.3 83.8  MCH 30.1 29.1 28.5  MCHC 35.0 34.1 34.0  RDW 15.2 15.2 15.3  LYMPHSABS 1.9  --   --   MONOABS 0.5  --   --   EOSABS 0.0  --   --   BASOSABS 0.0  --   --     Chemistries   Recent Labs Lab 06/16/14 1117 06/16/14 1445 06/17/14 0409 06/17/14 0930  NA 135*  --  133*  --   K 4.9   --  5.7* 4.8  CL 96  --  98  --   CO2 18*  --  17*  --   GLUCOSE 147*  --  133*  --   BUN 31*  --  47*  --   CREATININE 2.17* 2.21* 2.08*  --   CALCIUM 9.6  --  9.1  --   AST 34  --   --   --  ALT 21  --   --   --   ALKPHOS 78  --   --   --   BILITOT 0.8  --   --   --    ------------------------------------------------------------------------------------------------------------------ estimated creatinine clearance is 37.7 mL/min (by C-G formula based on Cr of 2.08). ------------------------------------------------------------------------------------------------------------------  Recent Labs  06/16/14 1445  HGBA1C 5.8*   ------------------------------------------------------------------------------------------------------------------ No results for input(s): CHOL, HDL, LDLCALC, TRIG, CHOLHDL, LDLDIRECT in the last 72 hours. ------------------------------------------------------------------------------------------------------------------ No results for input(s): TSH, T4TOTAL, T3FREE, THYROIDAB in the last 72 hours.  Invalid input(s): FREET3 ------------------------------------------------------------------------------------------------------------------ No results for input(s): VITAMINB12, FOLATE, FERRITIN, TIBC, IRON, RETICCTPCT in the last 72 hours.  Coagulation profile No results for input(s): INR, PROTIME in the last 168 hours.  No results for input(s): DDIMER in the last 72 hours.  Cardiac Enzymes  Recent Labs Lab 06/16/14 1819  TROPONINI <0.30   ------------------------------------------------------------------------------------------------------------------ Invalid input(s): POCBNP     Time Spent in minutes   35   Tuere Nwosu K M.D on 06/17/2014 at 11:20 AM  Between 7am to 7pm - Pager - [phone]  After 7pm go to [email or web address] - password [email or web address]  And look for the night coverage person covering for me after hours  [facility] Office   [phone]

## 2014-06-17 NOTE — Plan of Care (Signed)
Problem: Phase I Progression Outcomes Goal: Pain controlled with appropriate interventions Outcome: Completed/Met Date Met:  06/17/14     

## 2014-06-17 NOTE — Progress Notes (Signed)
NURSING PROGRESS NOTE  Melissa Stanton [id number] Admission Data: 06/17/2014 10:42 AM Attending Provider: Leroy SeaPrashant K Singh, MD WUJ:[id number]:[name], [name], DO Code Status: FULL  Melissa Stanton is a 53 y.o. female patient admitted from ED:  -No acute distress noted.  -No complaints of shortness of breath.  -No complaints of chest pain.   Cardiac Monitoring: Box # 1 in place. Cardiac monitor yields:sinus tachycardia.  Blood pressure 154/81, pulse 99, temperature 98.2 F (36.8 C), temperature source Oral, resp. rate 15, height 5\' 5"  (1.651 m), weight 110 kg (242 lb 8.1 oz), SpO2 100 %.   IV Fluids:  IV in place, occlusive dsg intact without redness, IV cath antecubital left, condition patent and no redness normal saline.   Allergies:  Other; Penicillins; Abilify; and Hydrocodone  Past Medical History:   has a past medical history of COPD (chronic obstructive pulmonary disease); Asthma; Bipolar disorder, unspecified; High blood pressure; PTSD (post-traumatic stress disorder); Gastric ulcer; CHF (congestive heart failure); Diabetes mellitus type II, controlled; Paranoid schizophrenia; TIA (transient ischemic attack); Shortness of breath dyspnea; Sleep apnea; GERD (gastroesophageal reflux disease); Headache; and Nerve damage.  Past Surgical History:   has past surgical history that includes Tonsillectomy (1968); Cesarean section (1983); Back surgery (2003); and Cardiac catheterization (2013).   Skin: generalize pink patches to body - pt states "happened after eating [employer]". Generalize swelling noted.   Patient/Family orientated to room. Information packet given to patient/family. Admission inpatient armband information verified with patient/family to include name and date of birth and placed on patient arm. Side rails up x 2, fall assessment and education completed with patient/family. Patient/family able to verbalize understanding of risk associated with falls and verbalized understanding to  call for assistance before getting out of bed. Call light within reach. Patient/family able to voice and demonstrate understanding of unit orientation instructions.    Will continue to evaluate and treat per MD orders.  Cathlyn Parsonsattha Bryan Goin, RN

## 2014-06-17 NOTE — Progress Notes (Signed)
Increased swelling noted to patient's face and eye area. Pt states "it feels like throat closing in" Resp 24. Expiratory wheezing noted throughout. MD Thedore MinsSingh made aware. Orders receive.

## 2014-06-17 NOTE — Plan of Care (Signed)
Problem: Phase I Progression Outcomes Goal: OOB as tolerated unless otherwise ordered Outcome: Progressing Goal: Voiding-avoid urinary catheter unless indicated Outcome: Completed/Met Date Met:  06/17/14

## 2014-06-18 LAB — BASIC METABOLIC PANEL
ANION GAP: 13 (ref 5–15)
BUN: 54 mg/dL — ABNORMAL HIGH (ref 6–23)
CALCIUM: 9 mg/dL (ref 8.4–10.5)
CO2: 21 mEq/L (ref 19–32)
Chloride: 99 mEq/L (ref 96–112)
Creatinine, Ser: 1.46 mg/dL — ABNORMAL HIGH (ref 0.50–1.10)
GFR calc Af Amer: 46 mL/min — ABNORMAL LOW (ref >90)
GFR, EST NON AFRICAN AMERICAN: 39 mL/min — AB (ref >90)
GLUCOSE: 125 mg/dL — AB (ref 70–99)
Potassium: 5.4 mEq/L — ABNORMAL HIGH (ref 3.7–5.3)
Sodium: 133 mEq/L — ABNORMAL LOW (ref 137–147)

## 2014-06-18 LAB — POTASSIUM
Potassium: 5.6 mEq/L — ABNORMAL HIGH (ref 3.7–5.3)
Potassium: 4.9 mEq/L (ref 3.7–5.3)

## 2014-06-18 LAB — GLUCOSE, CAPILLARY
GLUCOSE-CAPILLARY: 161 mg/dL — AB (ref 70–99)
GLUCOSE-CAPILLARY: 138 mg/dL — AB (ref 70–99)
Glucose-Capillary: 136 mg/dL — ABNORMAL HIGH (ref 70–99)
Glucose-Capillary: 120 mg/dL — ABNORMAL HIGH (ref 70–99)

## 2014-06-18 MED ORDER — SODIUM POLYSTYRENE SULFONATE 15 GM/60ML PO SUSP
30.0000 g | Freq: Once | ORAL | Status: AC
  Administered 2014-06-18: 30 g via ORAL
  Filled 2014-06-18: qty 120

## 2014-06-18 MED ORDER — EPINEPHRINE 0.3 MG/0.3ML IJ SOAJ: 0.3000 mg | Freq: Once | INTRAMUSCULAR | Status: AC

## 2014-06-18 MED ORDER — FUROSEMIDE 10 MG/ML IJ SOLN
20.0000 mg | Freq: Once | INTRAMUSCULAR | Status: AC
  Administered 2014-06-18: 20 mg via INTRAVENOUS
  Filled 2014-06-18: qty 2

## 2014-06-18 MED ORDER — PREDNISONE 5 MG PO TABS: ORAL_TABLET | ORAL | Status: AC

## 2014-06-18 MED ORDER — PREDNISONE 50 MG PO TABS
60.0000 mg | ORAL_TABLET | Freq: Every day | ORAL | Status: DC
  Administered 2014-06-18 – 2014-06-19 (×2): 60 mg via ORAL
  Filled 2014-06-18 (×3): qty 1

## 2014-06-18 MED ORDER — ONDANSETRON HCL 4 MG PO TABS: 4.0000 mg | ORAL_TABLET | Freq: Once | ORAL | Status: AC

## 2014-06-18 MED ORDER — ALBUTEROL SULFATE HFA 108 (90 BASE) MCG/ACT IN AERS: 1.0000 | INHALATION_SPRAY | Freq: Four times a day (QID) | RESPIRATORY_TRACT | Status: AC | PRN

## 2014-06-18 MED ORDER — SODIUM CHLORIDE 0.9 % IV SOLN: INTRAVENOUS | Status: DC

## 2014-06-18 MED ORDER — RANITIDINE HCL 150 MG PO TABS: 150.0000 mg | ORAL_TABLET | Freq: Two times a day (BID) | ORAL | Status: DC

## 2014-06-18 MED ORDER — SODIUM POLYSTYRENE SULFONATE 15 GM/60ML PO SUSP
30.0000 g | Freq: Once | ORAL | Status: AC
Start: 2014-06-18 — End: 2014-06-18
  Administered 2014-06-18: 30 g via RECTAL
  Filled 2014-06-18: qty 120

## 2014-06-18 MED ORDER — ALBUTEROL SULFATE (2.5 MG/3ML) 0.083% IN NEBU: 2.5000 mg | INHALATION_SOLUTION | Freq: Four times a day (QID) | RESPIRATORY_TRACT | Status: AC | PRN

## 2014-06-18 MED ORDER — HYDROXYZINE HCL 25 MG PO TABS
25.0000 mg | ORAL_TABLET | Freq: Once | ORAL | Status: AC
  Administered 2014-06-18: 25 mg via ORAL
  Filled 2014-06-18: qty 1

## 2014-06-18 MED ORDER — OXYCODONE-ACETAMINOPHEN 5-325 MG PO TABS: 1.0000 | ORAL_TABLET | Freq: Four times a day (QID) | ORAL | Status: DC | PRN

## 2014-06-18 NOTE — Progress Notes (Addendum)
Pt alert to self, pt having hallucinations, prior to this evening pt a/o x 4, Dr. Nedra HaiLee notified, Dr. 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

## 2014-06-18 NOTE — Progress Notes (Signed)
Pt. High fall risk.  Attempting to get OOB on own.  Generalized and peri-orbital edema profoundly evident.  Pt. Is oriented to place, but states she "will not stay" and is adamant re:  Going home.  Belligerent this am.  During conversation, however, patient was lethargic to the point of falling asleep during conversation.  MD aware.  See NO.

## 2014-06-18 NOTE — Progress Notes (Signed)
Potassium level continues to be pending.  Pt. Steadier on her feet, however continues with increased generalized edema and noticeable peri-orbital edema remains. Pr. Complaining of 'severe itching' in hands and feet.  Face, hands and feet reddened, however raised rash not apparent.  Patient states to Clinical research associatewriter and to other nurse that she is encumbered by the CPAP device and refuses to wear it at home.  "It suffocates me".  Spoke with patient about the pulmonary benefits of CPAP device, but she stated she 'didn't care' and was not going to comply with it's use.  She states she wants to rely on her medication (nebulizers) to help her breath.  She states she takes lasix at home, but admits to being non-compliant with medications and with her diet.

## 2014-06-18 NOTE — Progress Notes (Signed)
Pt sats were between 88-90 on room air. RT put Pt on 3L [place]

## 2014-06-18 NOTE — Discharge Summary (Signed)
Melissa Stanton, is a 53 y.o. female  DOB 08/28/1960  MRN [id number].  Admission date:  06/16/2014  Admitting Physician  Ripudeep Jenna LuoK Rai, MD  Discharge Date:  06/18/2014   Primary MD  Quitman LivingsHASSAN,SAMI, MD  Recommendations for primary care physician for things to follow:   Please check CBC, BMP in 2 days. Monitor clinically for possible utricarial allergic reaction   Admission Diagnosis  Rash [R21] SOB (shortness of breath) [R06.02] Hypoxia [R09.02] AKI (acute kidney injury) [N17.9]   Discharge Diagnosis  Rash [R21] SOB (shortness of breath) [R06.02] Hypoxia [R09.02] AKI (acute kidney injury) [N17.9]     Active Problems:   Essential hypertension, benign   Severe obesity (BMI >= 40)   Diabetes mellitus type II, controlled   COPD exacerbation   Diarrhea   Nausea & vomiting   Acute kidney injury   Urticaria      Past Medical History  Diagnosis Date  . COPD (chronic obstructive pulmonary disease)   . Asthma   . Bipolar disorder, unspecified   . High blood pressure   . PTSD (post-traumatic stress disorder)   . Gastric ulcer   . CHF (congestive heart failure)   . Diabetes mellitus type II, controlled   . Paranoid schizophrenia   . TIA (transient ischemic attack)   . Shortness of breath dyspnea   . Sleep apnea   . GERD (gastroesophageal reflux disease)   . Headache   . Nerve damage     Past Surgical History  Procedure Laterality Date  . Tonsillectomy  1968  . Cesarean section  1983  . Back surgery  2003    Dr.Broom, has lumbar spine implants [facility]-3D spinal system  . Cardiac catheterization  2013       History of present illness and  Hospital Course:     Kindly see H&P for history of present illness and admission details, please review complete Labs, Consult reports and Test reports for all details in  brief  HPI  from the history and physical done on the day of admission  Patient is a 53 year old female with asthma, COPD, bipolar disorder, diabetes, hypertension presented to ER with diffuse rash and above symptoms for last 3 days. Patient reported that she ate at [name] 3 days ago on Tuesday. Patient reports that she had a [place] pizza with cheese, beans and tomato toppings, she only had one slice when she felt something "off". She did not finish the pizza got home, after half an hour noticed rash on her back. She tried over-the-counter Benadryl without relief. Patient went to her PCP yesterday and was given prednisone pack, Phenergan and Bactrim. Patient felt her symptoms were worsening and presented to the ED, she was noticed to be hypoxic with pulse ox of 87% on room air. She also reported intractable nausea, vomiting and diarrhea since the [employer] meal. She denies any abdominal pain, fevers but admits to having chills, no hematemesis, hematochezia or melena. No joint pains or swelling. In ED, patient was noted to have sodium  of 135, potassium 4.9, BUN 31 metabolic acidosis with CO2 18, creatinine 2.17, BUN 31. Hemoglobin 19.5, hematocrit 55.7.  Hospital Course    1.Utricaria - with nausea vomiting and diarrhea. After eating at [employer] a few days ago, much improved with supportive care which included IV steroids and IV H1 and H2-blockers. IV fluids. Antinausea medication. No throat itching or tongue swelling. Rash improving. Feels much better and wants to go home. We will provide steroid taper, Benadryl as needed, Zantac scheduled. Follow with PCP closely.   2. Gastroenteritis. Resolved.   3.DM2 - A1c 5.8, on diet control continue post discharge.    4. Essential hypertension, benign - on Coreg continue.    5. ARF. Due to dehydration from nausea vomiting and diarrhea. Improved and almost resolved with IV fluids. Avoid nephrotoxins. Request PCP to recheck BMP in 2 days. Mildly  high K. Kayexalate given here. We'll repeat potassium prior to discharge to document stability.   6. Morbid obesity. Outpatient follow-up with PCP   7. Obstructive sleep apnea. Did not wear C Pap last night, consulted on compliance, initially was sleepy today am, now alert awake ambulating without any distress and eager to go home.      Discharge Condition: Stable   Follow UP  Follow-up Information    Follow up with [facility], MD. Schedule an appointment as soon as possible for a visit in 2 days.   Specialty:  Internal Medicine   Contact information:   [address] [place] [address] [phone]         Discharge Instructions  and  Discharge Medications          Discharge Instructions    Diet - low sodium heart healthy    Complete by:  As directed      Discharge instructions    Complete by:  As directed   If you develop itching in her throat, tongue swelling, difficulty in breathing, rash getting worse. Call 911 and come back to the ER immediately.    Follow with Primary MD HASSAN,SAMI, MD in 2 days   Get CBC, CMP, 2 view Chest X ray checked  by Primary MD next visit.    Activity: As tolerated with Full fall precautions use walker/cane & assistance as needed   Disposition Home    Diet: Heart Healthy    For Heart failure patients - Check your Weight same time everyday, if you gain over 2 pounds, or you develop in leg swelling, experience more shortness of breath or chest pain, call your Primary MD immediately. Follow Cardiac Low Salt Diet and 1.8 lit/day fluid restriction.   On your next visit with your primary care physician please Get Medicines reviewed and adjusted.   Please request your Prim.MD to go over all Hospital Tests and Procedure/Radiological results at the follow up, please get all Hospital records sent to your Prim MD by signing hospital release before you go home.   If you experience worsening of your admission symptoms,  develop shortness of breath, life threatening emergency, suicidal or homicidal thoughts you must seek medical attention immediately by calling 911 or calling your MD immediately  if symptoms less severe.  You Must read complete instructions/literature along with all the possible adverse reactions/side effects for all the Medicines you take and that have been prescribed to you. Take any new Medicines after you have completely understood and accpet all the possible adverse reactions/side effects.   Do not drive, operating heavy machinery, perform activities at  heights, swimming or participation in water activities or provide baby sitting services if your were admitted for syncope or siezures until you have seen by Primary MD or a Neurologist and advised to do so again.  Do not drive when taking Pain medications.    Do not take more than prescribed Pain, Sleep and Anxiety Medications  Special Instructions: If you have smoked or chewed Tobacco  in the last 2 yrs please stop smoking, stop any regular Alcohol  and or any Recreational drug use.  Wear Seat belts while driving.   Please note  You were cared for by a hospitalist during your hospital stay. If you have any questions about your discharge medications or the care you received while you were in the hospital after you are discharged, you can call the unit and asked to speak with the hospitalist on call if the hospitalist that took care of you is not available. Once you are discharged, your primary care physician will handle any further medical issues. Please note that NO REFILLS for any discharge medications will be authorized once you are discharged, as it is imperative that you return to your primary care physician (or establish a relationship with a primary care physician if you do not have one) for your aftercare needs so that they can reassess your need for medications and monitor your lab values.     Increase activity slowly    Complete by:   As directed             Medication List    STOP taking these medications        diphenhydrAMINE 25 MG tablet  Commonly known as:  SOMINEX     methylPREDNIsolone 4 MG tablet  Commonly known as:  MEDROL DOSPACK     naproxen 500 MG tablet  Commonly known as:  NAPROSYN     promethazine 25 MG tablet  Commonly known as:  PHENERGAN     ROBITUSSIN DM PO     sulfamethoxazole-trimethoprim 800-160 MG per tablet  Commonly known as:  BACTRIM DS,SEPTRA DS      TAKE these medications        albuterol 108 (90 BASE) MCG/ACT inhaler  Commonly known as:  PROVENTIL HFA;VENTOLIN HFA  Inhale 1 puff into the lungs every 6 (six) hours as needed for wheezing or shortness of breath.     albuterol (2.5 MG/3ML) 0.083% nebulizer solution  Commonly known as:  PROVENTIL  Take 3 mLs (2.5 mg total) by nebulization every 6 (six) hours as needed for wheezing or shortness of breath.     ALLERGY 10 MG tablet  Generic drug:  loratadine  Take 10 mg by mouth daily as needed for allergies.     aspirin EC 81 MG tablet  Take 81 mg by mouth daily.     baclofen 10 MG tablet  Commonly known as:  LIORESAL  Take 1 tablet (10 mg total) by mouth 3 (three) times daily as needed for muscle spasms.     butalbital-acetaminophen-caffeine 50-325-40 MG per tablet  Commonly known as:  FIORICET, ESGIC  Take 1 tablet by mouth 2 (two) times daily as needed for headache.     carvedilol 6.25 MG tablet  Commonly known as:  COREG  Take 6.25 mg by mouth 2 (two) times daily.     EPINEPHrine 0.3 mg/0.3 mL Soaj injection  Commonly known as:  EPI-PEN  Inject 0.3 mLs (0.3 mg total) into the muscle once. If you developed tongue swelling, throat itching  or shortness of breath take this shot and call 911.     FLUoxetine 20 MG capsule  Commonly known as:  PROZAC  Take 20 mg by mouth daily.     furosemide 20 MG tablet  Commonly known as:  LASIX  Take 1 tablet (20 mg total) by mouth daily.     gabapentin 400 MG capsule   Commonly known as:  NEURONTIN  Take 400 mg by mouth 3 (three) times daily.     ipratropium-albuterol 0.5-2.5 (3) MG/3ML Soln  Commonly known as:  DUONEB  Take 3 mLs by nebulization every 4 (four) hours as needed (wheezing).     LORazepam 1 MG tablet  Commonly known as:  ATIVAN  Take 1 mg by mouth 3 (three) times daily.     nitroGLYCERIN 0.4 MG/SPRAY spray  Commonly known as:  NITROLINGUAL  Place 1 spray under the tongue every 5 (five) minutes x 3 doses as needed for chest pain.     omeprazole 20 MG capsule  Commonly known as:  PRILOSEC  Take 20 mg by mouth daily.     ondansetron 4 MG tablet  Commonly known as:  ZOFRAN  Take 1 tablet (4 mg total) by mouth once.     oxyCODONE-acetaminophen 5-325 MG per tablet  Commonly known as:  PERCOCET/ROXICET  Take 1-2 tablets by mouth every 6 (six) hours as needed for severe pain.     pravastatin 20 MG tablet  Commonly known as:  PRAVACHOL  Take 1 tablet (20 mg total) by mouth daily.     predniSONE 5 MG tablet  Commonly known as:  DELTASONE  Label  & dispense according to the schedule below. 8 Pills PO for 3 days, 6 Pills PO for 3 days, 4 Pills PO for 3 days, 2 Pills PO for 3 days and stop     ranitidine 150 MG tablet  Commonly known as:  ACID REDUCER  Take 1 tablet (150 mg total) by mouth 2 (two) times daily.          Diet and Activity recommendation: See Discharge Instructions above   Consults obtained -  ID   Major procedures and Radiology Reports - PLEASE review detailed and final reports for all details, in brief -       Dg Chest 2 View  06/16/2014   CLINICAL DATA:  Diffuse rash, initial evaluation  EXAM: CHEST  2 VIEW  COMPARISON:  05/23/2014  FINDINGS: The heart size and vascular pattern are normal. Mild diffuse interstitial change is stable. No consolidation effusion or pneumothorax.  IMPRESSION: Stable mild chronic interstitial lung change.  No acute findings   Electronically Signed   By: Esperanza Heir M.D.    On: 06/16/2014 12:23      Micro Results      Recent Results (from the past 240 hour(s))  Urine culture     Status: None   Collection Time: 06/16/14  5:41 PM  Result Value Ref Range Status   Specimen Description URINE, CLEAN CATCH  Final   Special Requests NONE  Final   Culture  Setup Time   Final    06/17/2014 01:37 Performed at [facility] Count   Final    >=100,000 COLONIES/ML Performed at [facility]    Culture   Final    Multiple bacterial morphotypes present, none predominant. Suggest appropriate recollection if clinically indicated. Performed at [facility]    Report Status 06/17/2014 FINAL  Final  Today   Subjective:   Melissa Stanton today has no headache,no chest abdominal pain,no new weakness tingling or numbness, feels much better wants to go home today.    Objective:   Blood pressure 130/60, pulse 92, temperature 98.4 F (36.9 C), temperature source Oral, resp. rate 18, height 5\' 5"  (1.651 m), weight 110 kg (242 lb 8.1 oz), SpO2 90 %.   Intake/Output Summary (Last 24 hours) at 06/18/14 2023 Last data filed at 06/18/14 1800  Gross per 24 hour  Intake   1170 ml  Output      0 ml  Net   1170 ml    Exam Awake Alert, Oriented x 3, No new F.N deficits, Normal affect Maurice.AT,PERRAL, no tongue swelling, minimal facial swelling if any much improved from before Supple Neck,No JVD, No cervical lymphadenopathy appriciated.  Symmetrical Chest wall movement, Good air movement bilaterally, CTAB RRR,No Gallops,Rubs or new Murmurs, No Parasternal Heave +ve B.Sounds, Abd Soft, Non tender, No organomegaly appriciated, No rebound -guarding or rigidity. No Cyanosis, Clubbing or edema, Utricarial rash much better  Data Review   CBC w Diff:  Lab Results  Component Value Date   WBC 6.3 06/17/2014   WBC 10.1 01/10/2014   HGB 16.2* 06/17/2014   HCT 47.6* 06/17/2014   PLT 256 06/17/2014   LYMPHOPCT 22 06/16/2014    MONOPCT 6 06/16/2014   EOSPCT 0 06/16/2014   BASOPCT 0 06/16/2014    CMP:  Lab Results  Component Value Date   NA 133* 06/18/2014   NA 140 01/10/2014   K 4.9 06/18/2014   CL 99 06/18/2014   CO2 21 06/18/2014   BUN 54* 06/18/2014   BUN 15 01/10/2014   CREATININE 1.46* 06/18/2014   PROT 7.4 06/16/2014   PROT 6.9 01/10/2014   ALBUMIN 3.6 06/16/2014   BILITOT 0.8 06/16/2014   ALKPHOS 78 06/16/2014   AST 34 06/16/2014   ALT 21 06/16/2014  .   Total Time in preparing paper work, data evaluation and todays exam - 35 minutes  Leroy Sea M.D on 06/18/2014 at 8:23 PM  [facility] Office  [phone]

## 2014-06-18 NOTE — Discharge Instructions (Signed)
Follow with Primary MD HASSAN,SAMI, MD in 2 days   Get CBC, CMP, 2 view Chest X ray checked  by Primary MD next visit.    Activity: As tolerated with Full fall precautions use walker/cane & assistance as needed   Disposition Home    Diet: Heart Healthy    For Heart failure patients - Check your Weight same time everyday, if you gain over 2 pounds, or you develop in leg swelling, experience more shortness of breath or chest pain, call your Primary MD immediately. Follow Cardiac Low Salt Diet and 1.8 lit/day fluid restriction.   On your next visit with your primary care physician please Get Medicines reviewed and adjusted.   Please request your Prim.MD to go over all Hospital Tests and Procedure/Radiological results at the follow up, please get all Hospital records sent to your Prim MD by signing hospital release before you go home.   If you experience worsening of your admission symptoms, develop shortness of breath, life threatening emergency, suicidal or homicidal thoughts you must seek medical attention immediately by calling 911 or calling your MD immediately  if symptoms less severe.  You Must read complete instructions/literature along with all the possible adverse reactions/side effects for all the Medicines you take and that have been prescribed to you. Take any new Medicines after you have completely understood and accpet all the possible adverse reactions/side effects.   Do not drive, operating heavy machinery, perform activities at heights, swimming or participation in water activities or provide baby sitting services if your were admitted for syncope or siezures until you have seen by Primary MD or a Neurologist and advised to do so again.  Do not drive when taking Pain medications.    Do not take more than prescribed Pain, Sleep and Anxiety Medications  Special Instructions: If you have smoked or chewed Tobacco  in the last 2 yrs please stop smoking, stop any regular  Alcohol  and or any Recreational drug use.  Wear Seat belts while driving.   Please note  You were cared for by a hospitalist during your hospital stay. If you have any questions about your discharge medications or the care you received while you were in the hospital after you are discharged, you can call the unit and asked to speak with the hospitalist on call if the hospitalist that took care of you is not available. Once you are discharged, your primary care physician will handle any further medical issues. Please note that NO REFILLS for any discharge medications will be authorized once you are discharged, as it is imperative that you return to your primary care physician (or establish a relationship with a primary care physician if you do not have one) for your aftercare needs so that they can reassess your need for medications and monitor your lab values.

## 2014-06-18 NOTE — Progress Notes (Signed)
Pt refuses CPAP at this time. RT will continue to monitor.  

## 2014-06-18 NOTE — Progress Notes (Signed)
Patient given lasix IV and lactulose enema as per MD order.  Will have repeat potassium level as ordered.  Will report to MD once known to determine discharge status.

## 2014-06-19 LAB — GLUCOSE, CAPILLARY: GLUCOSE-CAPILLARY: 103 mg/dL — AB (ref 70–99)

## 2014-06-19 LAB — POTASSIUM: Potassium: 4.8 mEq/L (ref 3.7–5.3)

## 2014-06-19 MED ORDER — DIPHENHYDRAMINE HCL 25 MG PO CAPS: 25.0000 mg | ORAL_CAPSULE | Freq: Four times a day (QID) | ORAL | Status: AC | PRN

## 2014-06-19 MED ORDER — FAMOTIDINE 40 MG PO TABS: 40.0000 mg | ORAL_TABLET | Freq: Two times a day (BID) | ORAL | Status: AC

## 2014-06-19 NOTE — Progress Notes (Addendum)
[facility]  The patient was not discharged as planned yesterday as she felt that her symptoms were recurring. She specifically felt that her tongue was swelling again. This morning, she states her only issue is severe itching of hands and feet along with continued swelling of hands, feet and face that is unchanged from yesterday. toungue is not swollen and she is not having trouble eating, drinking or breathing. Exam reveals mild swelling of face and hand.  We [facility] the next couple of days. Contact information given.  She understand to continue the Prednisone taper, Pepcid BID, Claritin and Benadryl. Epi pen script given.   She remians stable for discharge- see d/c summary from 06/18/14.   Calvert CantorSaima Aela Bohan, MD

## 2014-06-19 NOTE — Plan of Care (Signed)
Problem: Phase I Progression Outcomes Goal: OOB as tolerated unless otherwise ordered Outcome: Completed/Met Date Met:  06/19/14 Goal: Initial discharge plan identified Outcome: Completed/Met Date Met:  06/19/14 Goal: Hemodynamically stable Outcome: Completed/Met Date Met:  06/19/14 Goal: Other Phase I Outcomes/Goals Outcome: Completed/Met Date Met:  06/19/14  Problem: Phase II Progression Outcomes Goal: Vital signs remain stable Outcome: Completed/Met Date Met:  06/19/14 Goal: IV changed to normal saline lock Outcome: Completed/Met Date Met:  06/19/14 Goal: Obtain order to discontinue catheter if appropriate Outcome: Not Applicable Date Met:  02/89/02

## 2014-06-19 NOTE — Progress Notes (Signed)
Follow-up:  Notified by RN that pt was supposed to be d/c'd this evening. However pt c/o increased itching and feeling like her tongue is swelling. Pt fears she is having another allergic reaction. At bedside pt noted resting in NAD. She awakens easily and is oriented.  Appears to have some facial edema and redness but day shift RN that cared for pt today is still present on the unit and states this is not new. Pt is able to talk in full sentences and is having no difficulty breathing. BBS w/ occasional exp wheeze. No upper airway stridor or wheezes. Pt c/o itching but no rash noted. 02 sats 90-91% 0n 3L [place] which appears to be pt's baseline. Pt does not appear to be having acute symptoms at this time. Will treat her itching with Hydroxyzine 25 mg PO and continue to monitor closely. Will defer d/c plans until am.  Leanne ChangKatherine P. Adren Dollins, NP-C [facility] Pager [phone]

## 2014-06-20 ENCOUNTER — Other Ambulatory Visit: Payer: Self-pay | Admitting: Internal Medicine

## 2014-07-16 ENCOUNTER — Other Ambulatory Visit: Payer: Self-pay | Admitting: Internal Medicine

## 2014-10-11 ENCOUNTER — Other Ambulatory Visit: Payer: Self-pay | Admitting: Internal Medicine

## 2014-10-19 ENCOUNTER — Other Ambulatory Visit: Payer: Self-pay

## 2014-10-19 MED ORDER — CARVEDILOL 6.25 MG PO TABS: 6.2500 mg | ORAL_TABLET | Freq: Two times a day (BID) | ORAL | Status: AC

## 2014-10-21 ENCOUNTER — Other Ambulatory Visit: Payer: Self-pay | Admitting: Internal Medicine

## 2014-11-03 ENCOUNTER — Encounter: Payer: Self-pay | Admitting: Internal Medicine

## 2014-11-07 ENCOUNTER — Encounter: Payer: MEDICARE | Admitting: Internal Medicine

## 2015-01-19 ENCOUNTER — Other Ambulatory Visit: Payer: Self-pay | Admitting: Internal Medicine

## 2015-01-30 ENCOUNTER — Telehealth: Payer: Self-pay | Admitting: *Deleted

## 2015-01-30 ENCOUNTER — Encounter: Payer: Medicare Other | Admitting: Internal Medicine

## 2015-01-30 DIAGNOSIS — Z0289 Encounter for other administrative examinations: Secondary | ICD-10-CM

## 2015-01-30 NOTE — Telephone Encounter (Signed)
Left message for patient to rerun call to the office, she didn't show for her physical today. Needs to reschedule appointment, [facility]

## 2015-03-10 ENCOUNTER — Telehealth: Payer: Self-pay | Admitting: Internal Medicine

## 2015-03-10 NOTE — Telephone Encounter (Signed)
Called Melissa Stanton on 01/30/2015 and again on 03/10/2015, someone answered the phone and stated that this number was no longer Melissa Stanton's phone number. Tried to call contact person which was Melissa Stanton. Melissa Stanton stated that he would get a message to her and have Melissa Stanton to call us about her appointment.

## 2015-03-16 ENCOUNTER — Encounter: Payer: Self-pay | Admitting: Internal Medicine

## 2016-11-03 IMAGING — CR DG CHEST 2V
2 series · 2 of 2 positions shown · non-contrast
Comparison: 05/23/2014

CLINICAL DATA: Diffuse rash, initial evaluation

EXAM:
CHEST  2 VIEW

[chest pa]
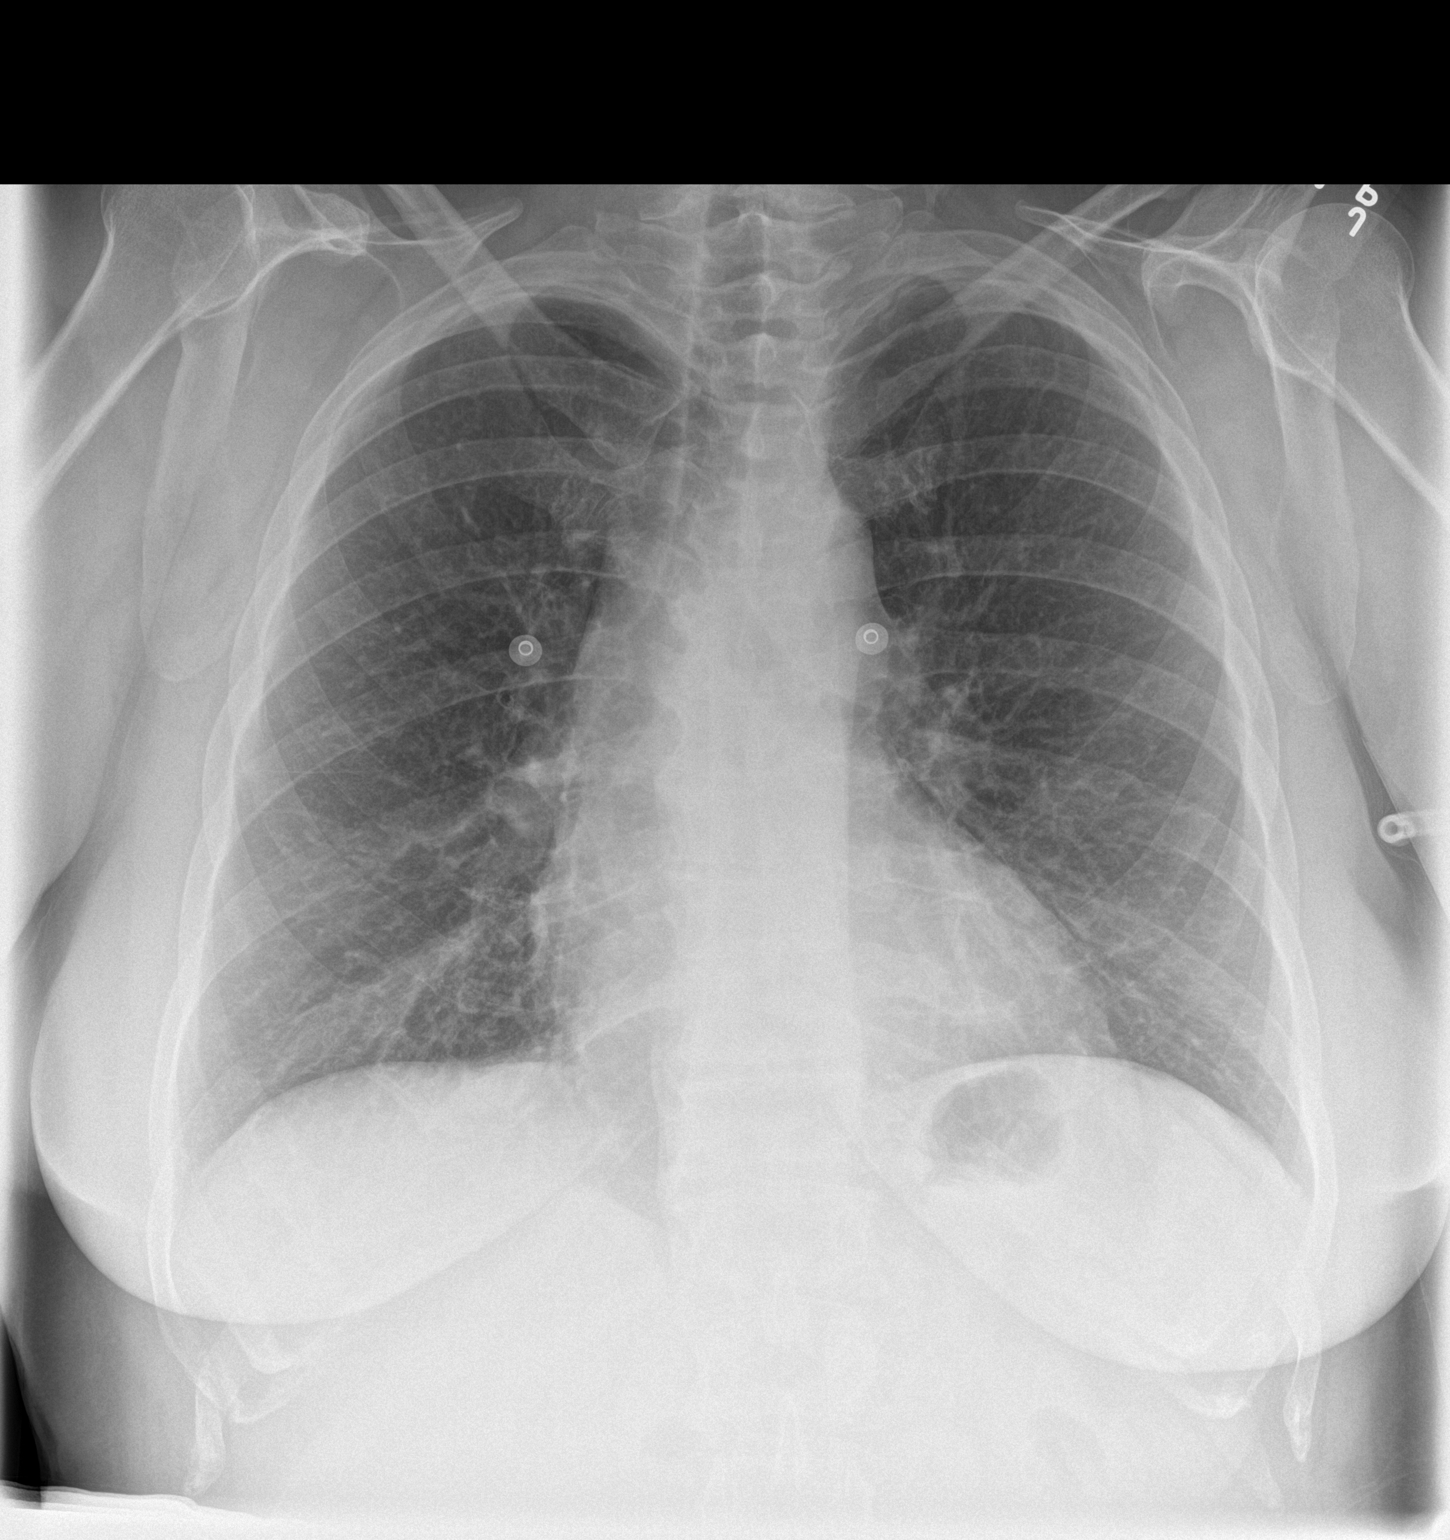

[chest lat]
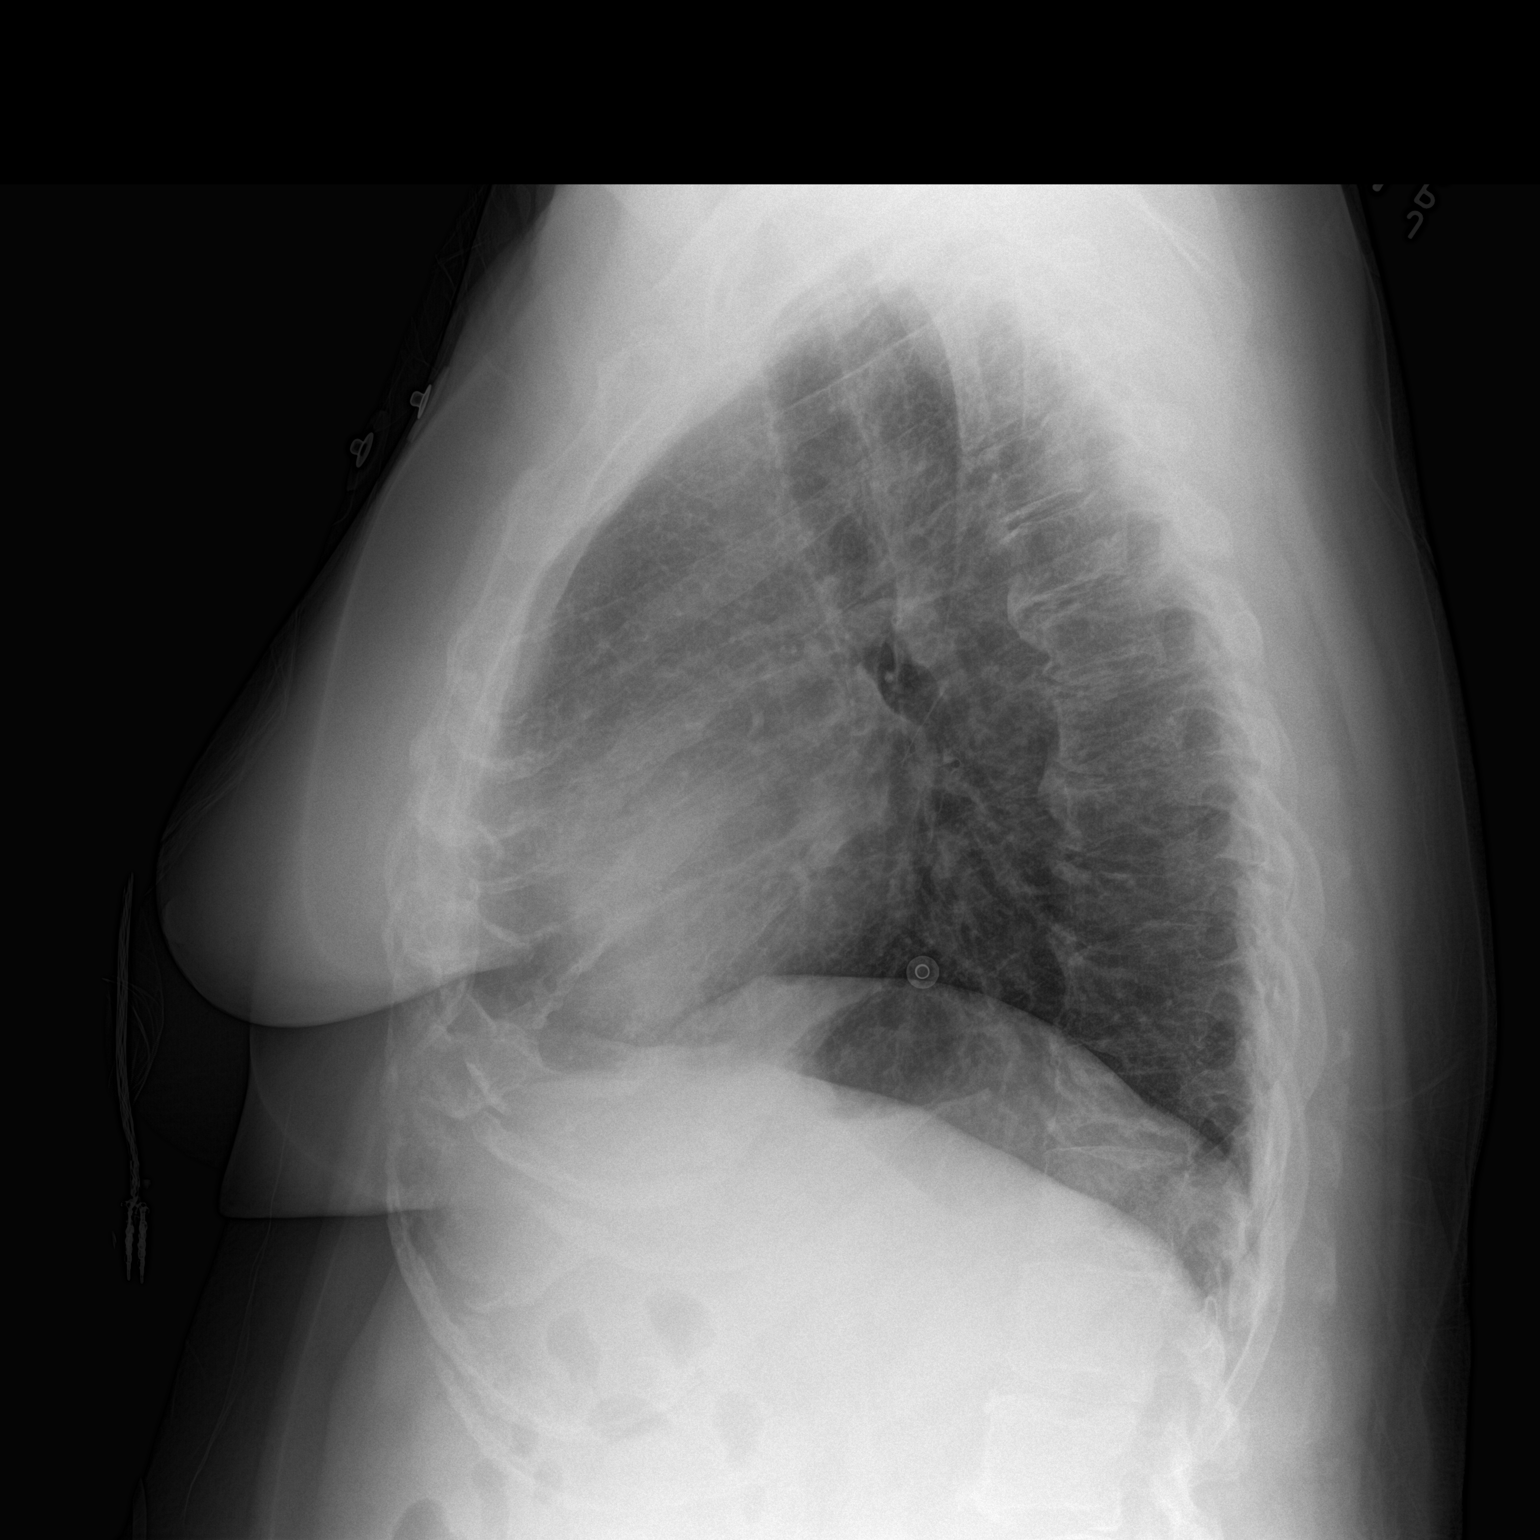

[2 of 2 positions shown; findings below may reference images not displayed]

FINDINGS: The heart size and vascular pattern are normal. Mild diffuse
interstitial change is stable. No consolidation effusion or
pneumothorax.
IMPRESSION: Stable mild chronic interstitial lung change.  No acute findings
